# Patient Record
Sex: Male | Born: 1940 | Race: White | Hispanic: No | Smoking: Never smoker
Health system: Southern US, Community
[De-identification: ages and names within clinical notes are randomized; demographics above are authoritative.]

## PROBLEM LIST (undated history)

## (undated) DIAGNOSIS — H3530 Unspecified macular degeneration: Secondary | ICD-10-CM

## (undated) DIAGNOSIS — I10 Essential (primary) hypertension: Secondary | ICD-10-CM

## (undated) DIAGNOSIS — M109 Gout, unspecified: Secondary | ICD-10-CM

## (undated) DIAGNOSIS — F40240 Claustrophobia: Secondary | ICD-10-CM

## (undated) DIAGNOSIS — L309 Dermatitis, unspecified: Secondary | ICD-10-CM

## (undated) DIAGNOSIS — Z86010 Personal history of colonic polyps: Secondary | ICD-10-CM

## (undated) DIAGNOSIS — J189 Pneumonia, unspecified organism: Secondary | ICD-10-CM

## (undated) DIAGNOSIS — Z860101 Personal history of adenomatous and serrated colon polyps: Secondary | ICD-10-CM

## (undated) DIAGNOSIS — I2510 Atherosclerotic heart disease of native coronary artery without angina pectoris: Secondary | ICD-10-CM

## (undated) DIAGNOSIS — I714 Abdominal aortic aneurysm, without rupture, unspecified: Secondary | ICD-10-CM

## (undated) DIAGNOSIS — E785 Hyperlipidemia, unspecified: Secondary | ICD-10-CM

## (undated) DIAGNOSIS — G4730 Sleep apnea, unspecified: Secondary | ICD-10-CM

## (undated) DIAGNOSIS — M1990 Unspecified osteoarthritis, unspecified site: Secondary | ICD-10-CM

## (undated) DIAGNOSIS — J45909 Unspecified asthma, uncomplicated: Secondary | ICD-10-CM

## (undated) DIAGNOSIS — Z87442 Personal history of urinary calculi: Secondary | ICD-10-CM

## (undated) HISTORY — DX: Essential (primary) hypertension: I10

## (undated) HISTORY — PX: POLYPECTOMY: SHX149

## (undated) HISTORY — DX: Hyperlipidemia, unspecified: E78.5

## (undated) HISTORY — DX: Personal history of adenomatous and serrated colon polyps: Z86.0101

## (undated) HISTORY — DX: Abdominal aortic aneurysm, without rupture: I71.4

## (undated) HISTORY — DX: Personal history of colonic polyps: Z86.010

## (undated) HISTORY — DX: Abdominal aortic aneurysm, without rupture, unspecified: I71.40

## (undated) HISTORY — DX: Gout, unspecified: M10.9

## (undated) HISTORY — DX: Unspecified osteoarthritis, unspecified site: M19.90

## (undated) HISTORY — DX: Unspecified asthma, uncomplicated: J45.909

## (undated) HISTORY — PX: TONSILLECTOMY: SUR1361

## (undated) HISTORY — DX: Dermatitis, unspecified: L30.9

## (undated) HISTORY — PX: COLONOSCOPY W/ POLYPECTOMY: SHX1380

---

## 1974-11-08 HISTORY — PX: RHINOPLASTY: SUR1284

## 2006-11-08 HISTORY — PX: OTHER SURGICAL HISTORY: SHX169

## 2007-05-16 ENCOUNTER — Encounter: Admission: RE | Admit: 2007-05-16 | Discharge: 2007-05-16 | Payer: Self-pay | Admitting: Orthopedic Surgery

## 2007-06-21 ENCOUNTER — Ambulatory Visit (HOSPITAL_BASED_OUTPATIENT_CLINIC_OR_DEPARTMENT_OTHER): Admission: RE | Admit: 2007-06-21 | Discharge: 2007-06-21 | Payer: Self-pay | Admitting: Orthopedic Surgery

## 2007-10-17 ENCOUNTER — Encounter: Admission: RE | Admit: 2007-10-17 | Discharge: 2007-10-17 | Payer: Self-pay | Admitting: Orthopedic Surgery

## 2007-11-23 ENCOUNTER — Ambulatory Visit (HOSPITAL_BASED_OUTPATIENT_CLINIC_OR_DEPARTMENT_OTHER): Admission: RE | Admit: 2007-11-23 | Discharge: 2007-11-23 | Payer: Self-pay | Admitting: Orthopedic Surgery

## 2008-04-17 ENCOUNTER — Inpatient Hospital Stay (HOSPITAL_COMMUNITY): Admission: RE | Admit: 2008-04-17 | Discharge: 2008-04-19 | Payer: Self-pay | Admitting: Orthopedic Surgery

## 2008-11-08 HISTORY — PX: RETINAL LASER PROCEDURE: SHX2339

## 2011-03-23 NOTE — Op Note (Signed)
NAMEELZIA, HOTT NO.:  [id number]   MEDICAL RECORD NO.:  [id number]          PATIENT TYPE:  AMB   LOCATION:  DSC                          FACILITY:  [facility]   PHYSICIAN:  Loreta Ave, M.D. DATE OF BIRTH:  1941/01/30   DATE OF PROCEDURE:  06/21/2007  DATE OF DISCHARGE:                               OPERATIVE REPORT   PREOPERATIVE DIAGNOSIS:  Medial meniscus tear, left knee.   POSTOPERATIVE DIAGNOSIS:  Degenerative tear medial and lateral meniscus,  left knee, with grade 2 chondromalacia of the patella and grade 2 and 3  changes medial femoral condyle.   PROCEDURE:  Left knee exam under anesthesia, arthroscopy with partial  medial and lateral meniscectomy, chondroplasty primarily medial femoral  condyle.   SURGEON:  Loreta Ave, M.D.   ASSISTANT:  Genene Churn. Barry Dienes, P.A.-C.   ANESTHESIA:  General.   BLOOD LOSS:  Minimal.   SPECIMENS:  None.   CULTURES:  None.   COMPLICATIONS:  None.   DRESSING:  Soft compressive.   DESCRIPTION OF PROCEDURE:  The patient was brought to the operating room  and placed on operating table in a supine position.  After adequate  anesthesia had been obtained, knee examined.  Good motion, good  stability, positive medial McMurray's.  Tourniquet and leg holder  applied.  Leg prepped and draped in the usual sterile fashion.  Three  portals created, one superolateral, one each medial and lateral  parapatellar.  Inflow catheter introduced, the knee distended,  arthroscope introduced, knee inspected.  Good patellofemoral tracking.  Grade 2 changes.  Cruciate was intact.  Medial side diffuse grade 3  changes and thinning on the condyle debrided with chondroplasty.  Still  remaining cartilage.  Plateau looked good.  Marked complex tearing  posterior half of medial meniscus with fragments flipped onto the  meniscus.  Most of the posterior half removed until I got to reasonable  tissue.  Tapered into remaining meniscus.   Lateral side had no  significant degenerative but radial tearing throughout the back and  middle of the lateral meniscus.  Saucerized out to a stable rim,  retaining fair amount of meniscus at  completion.  The entire knee examined and no other findings appreciated.  Instruments and fluid removed.  Portals were injected with Marcaine.  Portals closed with 4-0 nylon.  Sterile compressive dressing applied.  Anesthesia reversed.  Brought to the recovery room.  Tolerated surgery  well.  No complications.      Loreta Ave, M.D.  Electronically Signed     DFM/MEDQ  D:  06/21/2007  T:  06/22/2007  Job:  [id number]

## 2011-03-23 NOTE — Op Note (Signed)
NAMEMYCAL, CONDE NO.:  [id number]   MEDICAL RECORD NO.:  [id number]          PATIENT TYPE:  INP   LOCATION:  5021                         FACILITY:  [facility]   PHYSICIAN:  Loreta Ave, M.D. DATE OF BIRTH:  [birth year]   DATE OF PROCEDURE:  04/17/2008  DATE OF DISCHARGE:                               OPERATIVE REPORT   PREOPERATIVE DIAGNOSIS:  Left knee medial compartment arthritis, end-  stage, bone-on-bone.   POSTOPERATIVE DIAGNOSIS:  Left knee medial compartment arthritis, end-  stage, bone-on-bone.   PROCEDURE:  Unicompartmental replacement, left knee.  Stryker partial  knee replacement.  Cemented pegged #5 femoral component.  Cemented #4  tibial component with 8-mm polyethylene insert.  Medial capsule release.   SURGEON:  Loreta Ave, MD   ASSISTANT:  Genene Churn. Barry Dienes, [place], present throughout the entire case as  necessary for timely completion of procedure.   ANESTHESIA:  General.   ESTIMATED BLOOD LOSS:  Minimal.   TOURNIQUET TIME:  1 hour 30 minutes.   SPECIMENS:  None.   CONSULTATIONS:  None.   COMPLICATIONS:  None.   DRESSING:  Soft compressive with knee immobilizer.   DRAINS:  None utilized.   PROCEDURE:  The patient was brought to the operating room and placed on  the operating table in supine position.  After adequate anesthesia had  been obtained, the knee was examined.  Minimal flexion contracture about  5 degrees of varus barely correctable to neutral.  Fairly flexion.  Other leg was stable.  Tourniquet was applied, prepped and draped in  usual sterile fashion.  Exsanguinated with Esmarch and tourniquet  inflated to 350 mmHg.  Straight incision along the medial border of the  patella, patellar tendon down to tibial tubercle.  Skin and subcutaneous  tissues divided.  Medial arthrotomy.  Sufficient mobilization with  release of the medial capsule for exposure and balancing the knee.  Grade 4 changes, medial compartment.  Mild  changes, lateral and  patellofemoral but those compartments looked good.  Remnants of medial  meniscus removed.  Extramedullary guide on the tibia.  Protecting the  tibial spine with a reciprocating saw, I measured and then resected  adequate tibia on the medial side to allow for the 8-mm implant.  This  was done with a 0-degree cup perpendicular to the long angle of the leg  to balance the knee.  Sized a #4 component.  All recess was examined and  all debris was removed.  I then measured the gap in flexion/extension  choosing the appropriate resection guide for the distal femoral cut.  This was done with the knee in extension.  With appropriate jigs in  place, distal cut was made protecting the surrounding structures.  I  then did the chamfer and posterior cuts on the femur with appropriate  jigs.  All cuts were then cleaned up and trimmed throughout.  All  periarticular spurs were removed.  All recess examined.  After  appropriate trials, I chose #5 to the femur, #4 on the tibia which fit  well.  On the front of the femur with the femoral  component, I was just  below the tied mark in good position.  Nicely balanced knee with full  extension, full flexion, and good mechanical axis, I chose the  appropriate trials.  Both the tibia and femur were then drilled for the  definitive components through the trials.  All trials were removed.  Copious irrigation with pulse irrigating device.  Cement prepared and  placed on the tibial and femoral components which were hammered in  place.  Excess cement removed.  Polyethylene attached to the tibial  component.  Knee reduced.  Once cement hardened, the knee was  reexamined.  Normal mechanical axis with some slight valgus alignment,  full extension, full flexion, and nice smooth filling throughout.  Wound  was irrigated.  Arthrotomy closed with #1 Vicryl.  Skin and subcutaneous  tissue with Vicryl and staples.  Margin of wound and knee injected with   Marcaine.  Sterile compressive dressing applied.  Tourniquet was  inflated and removed.  Knee immobilizer applied.  Anesthesia reversed.  Brought to the recovery room.  Tolerated the surgery well with no  complications.      Loreta Ave, M.D.  Electronically Signed     DFM/MEDQ  D:  04/17/2008  T:  04/18/2008  Job:  [id number]

## 2011-03-23 NOTE — Op Note (Signed)
[name], Grant Edwards NO.:  [id number]   MEDICAL RECORD NO.:  [id number]          PATIENT TYPE:  AMB   LOCATION:  DSC                          FACILITY:  [facility]   PHYSICIAN:  Loreta Ave, M.D. 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   PROCEDURE:  Left knee exam under anesthesia, arthroscopy, debridement of  the midportion of the medial and lateral meniscus.  Extensive  chondroplasty, debridement and then microfracturing of the area of  avascular necrosis medial femoral condyle.  Loose bodies removed.   SURGEON:  Loreta Ave, M.D.   ASSISTANT:  Genene Churn. Denton Meek.   ANESTHESIA:  General   BLOOD LOSS:  Minimal.   SPECIMENS:  None.   CULTURES:  None.   COMPLICATIONS:  None.   DRESSINGS:  Soft compressive.   DESCRIPTION OF PROCEDURE:  The patient brought to the operating room,  placed on the operating table in the supine position.  After adequate  anesthesia had been obtained, tourniquet and leg holder applied.  Leg  prepped and draped in the usual sterile fashion.  Three portals created,  one superolateral, one each medial-and-lateral parapatellar.  Inflow  catheter introduced yielding a fair amount of reactive serous joint  fluid.  Arthroscope was introduced and the knee distended, knee  inspected.  Some grade 2 chondromalacia on the peak of patella not  extensive.  Good  tracking.   Lateral compartment looked good, but some free edge tearing of the  lateral meniscus, middle and anterior third, debrided out, and tapered  in smoothly.  Cruciate ligaments was intact.  Medial compartment  revealed previous partial medial meniscectomy with some new free edge  tears in the front.  They were debrided off and tapered in smoothly.  Unfortunately, the medial femoral condyle had an area of full-thickness  chondral loss and avascular necrosis measuring 15 x 10 mm right in the  central portion.   All the fragmented bone and cartilage over the top of the lesion  debrided.  This was a contained lesion, but quite large.  I then did a  series of very deep microfracturing throughout.  Lowered the pressure of  the fluid in the knee, to confirm I got bleeding throughout.   Once this was confirmed, the entire knee was examined to ensure all  fragments removed.  Instruments and fluid removed.  Portals injected  Marcaine and closed with nylon.  Sterile compressive dressing applied.  Anesthesia reversed.  Brought to recovery room.  Tolerated surgery well  without complications.      Loreta Ave, M.D.  Electronically  Signed     DFM/MEDQ  D:  11/23/2007  T:  11/23/2007  Job:  [id number]

## 2011-07-29 LAB — BASIC METABOLIC PANEL
BUN: 15
Chloride: 103
Glucose, Bld: 89
Potassium: 4
Sodium: 139

## 2011-07-29 LAB — POCT HEMOGLOBIN-HEMACUE: Hemoglobin: 14.5

## 2011-08-05 LAB — URINALYSIS, ROUTINE W REFLEX MICROSCOPIC
Glucose, UA: NEGATIVE
Hgb urine dipstick: NEGATIVE
Nitrite: NEGATIVE
Protein, ur: NEGATIVE

## 2011-08-05 LAB — CBC
HCT: 40.3
HCT: 45.4
Hemoglobin: 14.3
MCHC: 35.1
MCV: 86.5
MCV: 87.2
RBC: 4.66
RBC: 4.76
RBC: 5.21
WBC: 11.1 — ABNORMAL HIGH
WBC: 7.7

## 2011-08-05 LAB — COMPREHENSIVE METABOLIC PANEL
ALT: 12
BUN: 13
CO2: 30
GFR calc non Af Amer: >60
Glucose, Bld: 85
Potassium: 3.5
Sodium: 140
Total Bilirubin: 1.1
Total Protein: 7.3

## 2011-08-05 LAB — TYPE AND SCREEN
ABO/RH(D): O POS
Antibody Screen: NEGATIVE

## 2011-08-05 LAB — BASIC METABOLIC PANEL
CO2: 28
Chloride: 98
Creatinine, Ser: 1.03
Creatinine, Ser: 1.07
GFR calc Af Amer: >60
GFR calc Af Amer: >60
GFR calc non Af Amer: >60
Glucose, Bld: 107 — ABNORMAL HIGH
Potassium: 4.2
Sodium: 135

## 2011-08-05 LAB — APTT: aPTT: 34

## 2011-08-23 LAB — BASIC METABOLIC PANEL
CO2: 27
Calcium: 10
Chloride: 103
GFR calc non Af Amer: >60
Potassium: 3.9
Sodium: 139

## 2011-08-23 LAB — POCT HEMOGLOBIN-HEMACUE
Hemoglobin: 16
Operator id: 116011

## 2015-03-03 ENCOUNTER — Other Ambulatory Visit: Payer: Self-pay | Admitting: Sports Medicine

## 2015-03-03 DIAGNOSIS — M5441 Lumbago with sciatica, right side: Secondary | ICD-10-CM

## 2015-03-04 ENCOUNTER — Other Ambulatory Visit: Payer: Self-pay | Admitting: Sports Medicine

## 2015-03-04 ENCOUNTER — Ambulatory Visit
Admission: RE | Admit: 2015-03-04 | Discharge: 2015-03-04 | Disposition: A | Discharge status: Home / Self Care | Payer: Self-pay | Source: Ambulatory Visit | Attending: Sports Medicine | Admitting: Sports Medicine

## 2015-03-04 DIAGNOSIS — M5441 Lumbago with sciatica, right side: Secondary | ICD-10-CM

## 2015-06-19 ENCOUNTER — Encounter: Payer: Self-pay | Admitting: Vascular Surgery

## 2015-07-11 ENCOUNTER — Encounter: Payer: Self-pay | Admitting: Vascular Surgery

## 2015-07-15 ENCOUNTER — Ambulatory Visit (INDEPENDENT_AMBULATORY_CARE_PROVIDER_SITE_OTHER): Payer: Medicare Other | Admitting: Vascular Surgery

## 2015-07-15 ENCOUNTER — Encounter: Payer: Self-pay | Admitting: Vascular Surgery

## 2015-07-15 VITALS — BP 134/79 | HR 91 | Resp 16 | Ht 71.5 in | Wt 253.0 lb

## 2015-07-15 DIAGNOSIS — I714 Abdominal aortic aneurysm, without rupture, unspecified: Secondary | ICD-10-CM

## 2015-07-15 NOTE — Progress Notes (Signed)
Patient name: Grant Edwards MRN: [id number] DOB: [birth year] Sex: male   Referred by: Joylene Draft  Reason for referral:  Chief Complaint  Patient presents with  . New Evaluation    AAA  referred by Dr Reed Breech    HISTORY OF PRESENT ILLNESS: Patient is today for discussion of recent diagnosis of abdominal aortic aneurysm. 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 Is no symptoms related to his aneurysm. He has no family history of aneurysm. 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 He had a severe event caused additional imaging and had relief with prednisone treatment at that time. He has no cardiac illness. He does report that he has gained approximately 20-25 pounds over the past several months and is working on exercise and better diet  Past Medical History  Diagnosis Date  . AAA (abdominal aortic aneurysm)   . Hypertension   . Arthritis     Past Surgical History  Procedure Laterality Date  . Retinal laser procedure  2010  . Adenoma polyp      removed  . Rhinoplasty  1976  . Tonsillectomy    . Knee intercompartmental replacement  2008    Dr. Noemi Chapel     Social History   Social History  . Marital Status: Married    Spouse Name: N/A  . Number of Children: N/A  . Years of Education: N/A   Occupational History  . Not on file.   Social History Main Topics  . Smoking status: Never Smoker   . Smokeless tobacco: Never Used  . Alcohol Use: No  . Drug Use: No  . Sexual Activity: Not on file   Other Topics Concern  . Not on file   Social History Narrative    Family History  Problem Relation Age of Onset  . Cancer Mother   . Heart disease Father   . Heart attack Father     Allergies as of 07/15/2015 - Review Complete 07/15/2015  Allergen Reaction Noted  . Aspirin Other (See Comments) 07/15/2015  . Erythromycin Rash 07/15/2015    Current Outpatient  Prescriptions on File Prior to Visit  Medication Sig Dispense Refill  . amLODipine (NORVASC) 5 MG tablet Take 5 mg by mouth daily.    . hydrochlorothiazide (HYDRODIURIL) 25 MG tablet Take 25 mg by mouth daily.    Marland Kitchen lisinopril (PRINIVIL,ZESTRIL) 40 MG tablet Take 40 mg by mouth daily.     No current facility-administered medications on file prior to visit.     REVIEW OF SYSTEMS:  Positives indicated with an "X"  CARDIOVASCULAR:  [ ]  chest pain   [ ]  chest pressure   [ ]  palpitations   [ ]  orthopnea   [ ]  dyspnea on exertion   [ ]  claudication   [ ]  rest pain   [ ]  DVT   [ ]  phlebitis PULMONARY:   [ ]  productive cough   [x ] asthma   [ ]  wheezing NEUROLOGIC:   [ ]  weakness  [ ]  paresthesias  [ ]  aphasia  [ ]  amaurosis  [ ]  dizziness HEMATOLOGIC:   [ ]  bleeding problems   [ ]  clotting disorders MUSCULOSKELETAL:  [ ]  joint pain   [ ]  joint swelling GASTROINTESTINAL: [ ]   blood in stool  [ ]   hematemesis GENITOURINARY:  [ ]   dysuria  [ ]   hematuria PSYCHIATRIC:  [ ]  history of major  depression INTEGUMENTARY:  [ ]  rashes  [ ]  ulcers CONSTITUTIONAL:  [ ]  fever   [ ]  chills  PHYSICAL EXAMINATION:  General: The patient is a well-nourished male, in no acute distress. Vital signs are BP 134/79 mmHg  Pulse 91  Resp 16  Ht 5' 11.5" (1.816 m)  Wt 253 lb (114.76 kg)  BMI 34.80 kg/m2  SpO2 98% Pulmonary: There is a good air exchange bilaterally without wheezing or rales. Abdomen: Soft and non-tender with normal pitch bowel sounds. Moderate obesity. I do not palpate an aneurysm Musculoskeletal: There are no major deformities.  There is no significant extremity pain. Neurologic: No focal weakness or paresthesias are detected, Skin: There are no ulcer or rashes noted. Psychiatric: The patient has normal affect. Cardiovascular: There is a regular rate and rhythm without significant murmur appreciated. Carotid arteries without bruits bilaterally Pulse status: 2+ radial 2+ femoral 2+ popliteal  2+ dorsalis pedis and posterior tibial pulses with no evidence of peripheral aneurysm  I did review his ultrasound from 03/04/2015. This does show a 3 cm infrarenal aneurysm.  Impression and Plan:  Small infrarenal abdominal aortic aneurysm. Had a long discussion with the patient explaining the natural history with slow progression in size of the small aneurysm. Explained that he does not need to have any limitation to his current activity. Does need to have good blood pressure control. We will see him in one year with repeat ultrasound to rule out progression in size. Splane open and stent graft repair should he develop larger aneurysm. Also explain symptoms of aneurysm which he would report immediately to the emergency room should this occur    Arias Weinert [facility] [place]: [phone]

## 2015-07-16 NOTE — Addendum Note (Signed)
Addended by: Dorthula Rue L on: 07/16/2015 02:22 PM   Modules accepted: Orders

## 2015-10-17 DIAGNOSIS — J3089 Other allergic rhinitis: Secondary | ICD-10-CM | POA: Diagnosis not present

## 2015-10-23 ENCOUNTER — Ambulatory Visit (INDEPENDENT_AMBULATORY_CARE_PROVIDER_SITE_OTHER): Payer: Medicare Other | Admitting: *Deleted

## 2015-10-23 DIAGNOSIS — J309 Allergic rhinitis, unspecified: Secondary | ICD-10-CM | POA: Diagnosis not present

## 2015-10-23 NOTE — Progress Notes (Signed)
Immunotherapy   Patient Details  Name: Grant Edwards MRN: [id number] Date of Birth: [birth year]  10/23/2015  [name] here to pick up  red vial 1:100 Following schedule: C  Frequency: every 2-3 times a week Epi-Pen:Prescription for Epi-Pen given  Consent signed and patient instructions given. 0.1cc given today in right arm.    Joellyn Rued 10/23/2015, 3:26 PM

## 2016-04-15 DIAGNOSIS — J3089 Other allergic rhinitis: Secondary | ICD-10-CM | POA: Diagnosis not present

## 2016-04-19 ENCOUNTER — Ambulatory Visit (INDEPENDENT_AMBULATORY_CARE_PROVIDER_SITE_OTHER): Payer: Medicare Other

## 2016-04-19 DIAGNOSIS — J309 Allergic rhinitis, unspecified: Secondary | ICD-10-CM

## 2016-04-19 MED ORDER — EPINEPHRINE 0.3 MG/0.3ML IJ SOAJ: 0.3000 mg | Freq: Once | INTRAMUSCULAR | Status: DC

## 2016-04-19 NOTE — Progress Notes (Signed)
Immunotherapy   Patient Details  Name: Grant Edwards MRN: [id number] Date of Birth: 02/13/1941  04/19/2016  [name] here to pick up RED 1/100 GRASS-DMITE-MOLD @0 .10 THEN AT 0.50 EVERY 2 WEEKS. Following schedule: C  Frequency:1 time per week Epi-Pen:Prescription for Epi-Pen given Consent signed and patient instructions given. RX FOR NEEDLES HANDWRITTEN & EPIPEN SENT IN.   Loleta Dicker 04/19/2016, 3:17 PM

## 2016-07-15 ENCOUNTER — Encounter: Payer: Self-pay | Admitting: Family

## 2016-07-20 ENCOUNTER — Ambulatory Visit (HOSPITAL_COMMUNITY): Payer: Medicare Other

## 2016-07-20 ENCOUNTER — Ambulatory Visit: Payer: Medicare Other | Admitting: Family

## 2016-07-22 ENCOUNTER — Encounter: Payer: Self-pay | Admitting: Family

## 2016-07-26 ENCOUNTER — Ambulatory Visit (INDEPENDENT_AMBULATORY_CARE_PROVIDER_SITE_OTHER): Payer: Medicare Other | Admitting: Family

## 2016-07-26 ENCOUNTER — Encounter: Payer: Self-pay | Admitting: Family

## 2016-07-26 ENCOUNTER — Ambulatory Visit (HOSPITAL_COMMUNITY)
Admission: RE | Admit: 2016-07-26 | Discharge: 2016-07-26 | Disposition: A | Discharge status: Home / Self Care | Payer: Medicare Other | Source: Ambulatory Visit | Attending: Vascular Surgery | Admitting: Vascular Surgery

## 2016-07-26 VITALS — BP 125/76 | HR 63 | Temp 97.6°F | Resp 16 | Ht 71.5 in | Wt 239.0 lb

## 2016-07-26 DIAGNOSIS — Z87891 Personal history of nicotine dependence: Secondary | ICD-10-CM | POA: Diagnosis not present

## 2016-07-26 DIAGNOSIS — I714 Abdominal aortic aneurysm, without rupture, unspecified: Secondary | ICD-10-CM

## 2016-07-26 NOTE — Progress Notes (Signed)
[facility]   CC: Follow up Abdominal Aortic Aneurysm  History of Present Illness  Grant Edwards is a 75 y.o. (11-23-1940) male patient of Dr. Donnetta Hutching who returns for follow up of his abdominal aortic aneurysm.   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 He has no family history of aneurysm. 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 He had a severe event which caused additional imaging and had relief with prednisone treatment at that time. He has no cardiac illness.  Dr. Donnetta Hutching last saw pt on 07/15/15. At that time Dr. Donnetta Hutching noted a small infrarenal abdominal aortic aneurysm. He had a long discussion with the patient explaining the natural history with slow progression in size of the small aneurysm. Explained that he does not need to have any limitation to his current activity. Does need to have good blood pressure control.  He stopped smoking at age 25.    Past Medical History:  Diagnosis Date  . AAA (abdominal aortic aneurysm) ([place])   . Arthritis   . Hypertension    Past Surgical History:  Procedure Laterality Date  . adenoma polyp     removed  . knee intercompartmental replacement  2008   Dr. Noemi Chapel   . RETINAL LASER PROCEDURE  2010  . RHINOPLASTY  1976  . TONSILLECTOMY     Social History Social History   Social History  . Marital status: Married    Spouse name: N/A  . Number of children: N/A  . Years of education: N/A   Occupational History  . Not on file.   Social History Main Topics  . Smoking status: Never Smoker  . Smokeless tobacco: Never Used  . Alcohol use No  . Drug use: No  . Sexual activity: Not on file   Other Topics Concern  . Not on file   Social History Narrative  . No narrative on file   Family History Family History  Problem Relation Age of Onset  . Cancer Mother   . Heart disease Father   . Heart attack  Father     Current Outpatient Prescriptions on File Prior to Visit  Medication Sig Dispense Refill  . amLODipine (NORVASC) 5 MG tablet Take 5 mg by mouth daily.    Marland Kitchen EPINEPHrine (EPIPEN 2-PAK) 0.3 mg/0.3 mL IJ SOAJ injection Inject 0.3 mLs (0.3 mg total) into the muscle once. 2 Device 2  . hydrochlorothiazide (HYDRODIURIL) 25 MG tablet Take 25 mg by mouth daily.    Marland Kitchen lisinopril (PRINIVIL,ZESTRIL) 40 MG tablet Take 40 mg by mouth daily.     No current facility-administered medications on file prior to visit.    Allergies  Allergen Reactions  . Aspirin Other (See Comments)    asthma  . Erythromycin Rash    ROS: See HPI for pertinent positives and negatives.  Physical Examination  Vitals:   07/26/16 0911  BP: 125/76  Pulse: 63  Resp: 16  Temp: 97.6 F (36.4 C)  TempSrc: Oral  SpO2: 97%  Weight: 239 lb (108.4 kg)  Height: 5' 11.5" (1.816 m)   Body mass index is 32.87 kg/m.  General: A&O x 3, WD, obese male.  Pulmonary: Sym exp, respirations are non labored, good air movt, CTAB, no rales, rhonchi, or wheezing.  Cardiac: RRR, Nl S1, S2, no detected murmur.   Carotid Bruits Right Left   Negative Negative   Aorta is not  palpable Radial pulses are 2+ palpable                          VASCULAR EXAM:                                                                                                         LE Pulses Right Left       FEMORAL   palpable   palpable        POPLITEAL  not palpable   not palpable       POSTERIOR TIBIAL   palpable    palpable        DORSALIS PEDIS      ANTERIOR TIBIAL  palpable   palpable      Gastrointestinal: soft, NTND, -G/R, - HSM, - masses palpated, - CVAT B.  Musculoskeletal: M/S 5/5 throughout, Extremities without ischemic changes.  Neurologic: CN 2-12 intact, Pain and light touch intact in extremities are intact, Motor exam as listed above.  Non-Invasive Vascular Imaging  AAA Duplex (07/26/2016)  Previous size: 3 cm (Date:  03/04/15, CT)  Current size:  2.6 cm (Date: 07/26/16), limited visualization, normal diameters if both CIA's.   Medical Decision Making  The patient is a 75 y.o. male who presents with asymptomatic AAA with no increase in size.   Based on this patient's exam and diagnostic studies, the patient will follow up in 1 year  with the following studies: AAA duplex.  Consideration for repair of AAA would be made when the size is 5.5 cm, growth > 1 cm/yr, and symptomatic status.  I emphasized the importance of maximal medical management including strict control of blood pressure, blood glucose, and lipid levels, antiplatelet agents, obtaining regular exercise, and continued cessation of smoking.   The patient was given information about AAA including signs, symptoms, treatment, and how to minimize the risk of enlargement and rupture of aneurysms.    The patient was advised to call [phone] should the patient experience sudden onset abdominal or back pain.   Thank you for allowing [place] to participate in this patient's care.  Clemon Chambers, RN, MSN, FNP-C [facility] Office: [facility] Physician: Trula Slade  07/26/2016, 9:22 AM

## 2016-07-26 NOTE — Patient Instructions (Addendum)
Abdominal Aortic Aneurysm An aneurysm is a weakened or damaged part of an artery wall that bulges from the normal force of blood pumping through the body. An abdominal aortic aneurysm is an aneurysm that occurs in the lower part of the aorta, the main artery of the body.  The major concern with an abdominal aortic aneurysm is that it can enlarge and burst (rupture) or blood can flow between the layers of the wall of the aorta through a tear (aorticdissection). Both of these conditions can cause bleeding inside the body and can be life threatening unless diagnosed and treated promptly. CAUSES  The exact cause of an abdominal aortic aneurysm is unknown. Some contributing factors are:   A hardening of the arteries caused by the buildup of fat and other substances in the lining of a blood vessel (arteriosclerosis).  Inflammation of the walls of an artery (arteritis).   Connective tissue diseases, such as Marfan syndrome.   Abdominal trauma.   An infection, such as syphilis or staphylococcus, in the wall of the aorta (infectious aortitis) caused by bacteria. RISK FACTORS  Risk factors that contribute to an abdominal aortic aneurysm may include:  Age older than 60 years.   High blood pressure (hypertension).  Male gender.  Ethnicity (white race).  Obesity.  Family history of aneurysm (first degree relatives only).  Tobacco use. PREVENTION  The following healthy lifestyle habits may help decrease your risk of abdominal aortic aneurysm:  Quitting smoking. Smoking can raise your blood pressure and cause arteriosclerosis.  Limiting or avoiding alcohol.  Keeping your blood pressure, blood sugar level, and cholesterol levels within normal limits.  Decreasing your salt intake. In somepeople, too much salt can raise blood pressure and increase your risk of abdominal aortic aneurysm.  Eating a diet low in saturated fats and cholesterol.  Increasing your fiber intake by including  whole grains, vegetables, and fruits in your diet. Eating these foods may help lower blood pressure.  Maintaining a healthy weight.  Staying physically active and exercising regularly. SYMPTOMS  The symptoms of abdominal aortic aneurysm may vary depending on the size and rate of growth of the aneurysm.Most grow slowly and do not have any symptoms. When symptoms do occur, they may include:  Pain (abdomen, side, lower back, or groin). The pain may vary in intensity. A sudden onset of severe pain may indicate that the aneurysm has ruptured.  Feeling full after eating only small amounts of food.  Nausea or vomiting or both.  Feeling a pulsating lump in the abdomen.  Feeling faint or passing out. DIAGNOSIS  Since most unruptured abdominal aortic aneurysms have no symptoms, they are often discovered during diagnostic exams for other conditions. An aneurysm may be found during the following procedures:  Ultrasonography (A one-time screening for abdominal aortic aneurysm by ultrasonography is also recommended for all men aged 65-75 years who have ever smoked).  X-ray exams.  A computed tomography (CT).  Magnetic resonance imaging (MRI).  Angiography or arteriography. TREATMENT  Treatment of an abdominal aortic aneurysm depends on the size of your aneurysm, your age, and risk factors for rupture. Medication to control blood pressure and pain may be used to manage aneurysms smaller than 6 cm. Regular monitoring for enlargement may be recommended by your caregiver if:  The aneurysm is 3-4 cm in size (an annual ultrasonography may be recommended).  The aneurysm is 4-4.5 cm in size (an ultrasonography every 6 months may be recommended).  The aneurysm is larger than 4.5 cm in   size (your caregiver may ask that you be examined by a vascular surgeon). If your aneurysm is larger than 6 cm, surgical repair may be recommended. There are two main methods for repair of an aneurysm:   Endovascular  repair (a minimally invasive surgery). This is done most often.  Open repair. This method is used if an endovascular repair is not possible.   This information is not intended to replace advice given to you by your health care provider. Make sure you discuss any questions you have with your health care provider.   Document Released: 08/04/2005 Document Revised: 02/19/2013 Document Reviewed: 11/24/2012 Elsevier Interactive Patient Education 2016 Elsevier Inc.    Before your next abdominal ultrasound:  Take two Extra-Strength Gas-X capsules at bedtime the night before the test. Take another two Extra-Strength Gas-X capsules 3 hours before the test.   

## 2016-08-23 DIAGNOSIS — J3089 Other allergic rhinitis: Secondary | ICD-10-CM | POA: Diagnosis not present

## 2016-09-02 ENCOUNTER — Ambulatory Visit: Payer: Medicare Other

## 2016-09-06 ENCOUNTER — Ambulatory Visit (INDEPENDENT_AMBULATORY_CARE_PROVIDER_SITE_OTHER): Payer: Medicare Other

## 2016-09-06 ENCOUNTER — Ambulatory Visit: Payer: Medicare Other

## 2016-09-06 DIAGNOSIS — J309 Allergic rhinitis, unspecified: Secondary | ICD-10-CM | POA: Diagnosis not present

## 2016-09-06 NOTE — Progress Notes (Signed)
Immunotherapy   Patient Details  Name: Grant Edwards MRN: [id number] Date of Birth: 05-04-1941  09/06/2016  [name] here to pick up red 1/100 Grass-Dmite-Mold at 0.10 Following schedule: C  Frequency:1 time per week Epi-Pen:Epi-Pen Available  Consent signed and patient instructions given.   Loleta Dicker 09/06/2016, 3:07 PM

## 2016-10-14 NOTE — Addendum Note (Signed)
Addended by: Lianne Cure A on: 10/14/2016 08:28 AM   Modules accepted: Orders

## 2016-11-30 NOTE — Addendum Note (Signed)
Addended by: Felipa Emory on: 11/30/2016 09:59 AM   Modules accepted: Orders

## 2017-02-10 DIAGNOSIS — J3089 Other allergic rhinitis: Secondary | ICD-10-CM

## 2017-02-21 ENCOUNTER — Ambulatory Visit: Payer: Self-pay

## 2017-06-21 NOTE — Progress Notes (Signed)
Mt vial made Exp. 06-22-18

## 2017-06-23 DIAGNOSIS — J301 Allergic rhinitis due to pollen: Secondary | ICD-10-CM | POA: Diagnosis not present

## 2017-07-04 ENCOUNTER — Ambulatory Visit: Payer: Self-pay

## 2017-07-06 ENCOUNTER — Ambulatory Visit (INDEPENDENT_AMBULATORY_CARE_PROVIDER_SITE_OTHER): Payer: Medicare Other | Admitting: *Deleted

## 2017-07-06 DIAGNOSIS — J309 Allergic rhinitis, unspecified: Secondary | ICD-10-CM

## 2017-07-06 NOTE — Progress Notes (Signed)
Immunotherapy   Patient Details  Name: Grant Edwards MRN: [id number] Date of Birth: [birth year]  07/06/2017  Grant Edwards here to pick up  red vial grass-dmite-mold Expiration date:06/22/18 Following schedule: C  Frequency: @0 .5cc every 2-3 weeks Epi-Pen: Epipen available.  Consent signed and patient instructions given.   [name] 07/06/2017, 1:55 PM

## 2017-07-26 ENCOUNTER — Ambulatory Visit: Payer: Medicare Other | Admitting: Pediatrics

## 2017-08-09 ENCOUNTER — Encounter: Payer: Self-pay | Admitting: Family

## 2017-08-09 ENCOUNTER — Ambulatory Visit (HOSPITAL_COMMUNITY)
Admission: RE | Admit: 2017-08-09 | Discharge: 2017-08-09 | Disposition: A | Discharge status: Home / Self Care | Payer: Medicare Other | Source: Ambulatory Visit | Attending: Family | Admitting: Family

## 2017-08-09 ENCOUNTER — Ambulatory Visit (INDEPENDENT_AMBULATORY_CARE_PROVIDER_SITE_OTHER): Payer: Medicare Other | Admitting: Family

## 2017-08-09 VITALS — BP 111/71 | HR 59 | Temp 97.3°F | Resp 18 | Ht 71.5 in | Wt 238.0 lb

## 2017-08-09 DIAGNOSIS — Z87891 Personal history of nicotine dependence: Secondary | ICD-10-CM

## 2017-08-09 DIAGNOSIS — R0989 Other specified symptoms and signs involving the circulatory and respiratory systems: Secondary | ICD-10-CM

## 2017-08-09 DIAGNOSIS — I714 Abdominal aortic aneurysm, without rupture, unspecified: Secondary | ICD-10-CM

## 2017-08-09 DIAGNOSIS — I499 Cardiac arrhythmia, unspecified: Secondary | ICD-10-CM

## 2017-08-09 NOTE — Progress Notes (Signed)
[facility]   CC: Follow up Abdominal Aortic Aneurysm  History of Present Illness  Eugune Sine is a 76 y.o. (1940/11/17) male patient of Dr. Donnetta Hutching who returns for follow up of his abdominal aortic aneurysm.   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 He has no family history of aneurysm. 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 He had a severe event which caused additional imaging and had relief with prednisone treatment at that time. He has no cardiac illness.   Dr. Donnetta Hutching last saw pt on 07/15/15. At that time Dr. Donnetta Hutching noted a small infrarenal abdominal aortic aneurysm. He had a long discussion with the patient explaining the natural history with slow progression in size of the small aneurysm. Explained that he does not need to have any limitation to his current activity. Does need to have good blood pressure control.  Pt states he is on a strict diet for weight reduction and states he has had some success.   The patient denies claudication in legs with walking. The patient denies history of stroke or TIA symptoms.  Pt denies fatigue, denies dyspnea, denies feeling lightheaded; states he has no known hx of cardiac arrhthymia.   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   Pt Diabetic: No Pt smoker: former smoker, quit at age 63 years   Past Medical History:  Diagnosis Date  . AAA (abdominal aortic aneurysm) ([place])   . Arthritis   . Hypertension    Past Surgical History:  Procedure Laterality Date  . adenoma polyp     removed  . knee intercompartmental replacement  2008   Dr. Noemi Chapel   . RETINAL LASER PROCEDURE  2010  . RHINOPLASTY  1976  . TONSILLECTOMY     Social History Social History   Social History  . Marital status: Married    Spouse name: N/A  . Number of children: N/A  . Years of  education: N/A   Occupational History  . Not on file.   Social History Main Topics  . Smoking status: Never Smoker  . Smokeless tobacco: Never Used  . Alcohol use No  . Drug use: No  . Sexual activity: Not on file   Other Topics Concern  . Not on file   Social History Narrative  . No narrative on file   Family History Family History  Problem Relation Age of Onset  . Cancer Mother   . Heart disease Father   . Heart attack Father     Current Outpatient Prescriptions on File Prior to Visit  Medication Sig Dispense Refill  . amLODipine (NORVASC) 5 MG tablet Take 5 mg by mouth daily.    Marland Kitchen EPINEPHrine (EPIPEN 2-PAK) 0.3 mg/0.3 mL IJ SOAJ injection Inject 0.3 mLs (0.3 mg total) into the muscle once. 2 Device 2  . hydrochlorothiazide (HYDRODIURIL) 25 MG tablet Take 25 mg by mouth daily.    Marland Kitchen lisinopril (PRINIVIL,ZESTRIL) 40 MG tablet Take 40 mg by mouth daily.     No current facility-administered medications on file prior to visit.    Allergies  Allergen Reactions  . Aspirin Other (See Comments)    asthma  . Erythromycin Rash    ROS: See HPI for pertinent positives and negatives.  Physical Examination  Vitals:   08/09/17 0847  BP: 111/71  Pulse: (!) 59  Resp: 18  Temp: (!) 97.3 F (36.3 C)  SpO2: 96%  Weight: 238 lb (108 kg)  Height: 5' 11.5" (1.816 m)   Body mass index is 32.73 kg/m.  General: A&O x 3, WD, obese male.  Pulmonary: Sym exp, respirations are non labored, good air movt, CTAB, no rales, rhonchi, or wheezing.  Cardiac: Irregular rhythm, rate at 59/minute Nl S1, S2, no detected murmur.   Carotid Bruits Right Left   Negative Negative    Abdominal aortic pulse is not palpable Radial pulses are 2+ palpable                          VASCULAR EXAM:                                                                                                                                           LE Pulses Right Left       FEMORAL not palpable (obese)   not palpable        POPLITEAL  2+ palpable   2+ palpable       POSTERIOR TIBIAL   palpable    palpable        DORSALIS PEDIS      ANTERIOR TIBIAL  palpable   palpable      Gastrointestinal: soft, NTND, -G/R, - HSM, - masses palpated, - CVAT B.  Musculoskeletal: M/S 5/5 throughout, Extremities without ischemic changes.  Neurologic: CN 2-12 intact, Pain and light touch intact in extremities are intact, Motor exam as listed above    Non-Invasive Vascular Imaging  AAA Duplex (08/09/2017)  Previous size: 2.6 cm (Date: 07/26/16), limited visualization, normal diameters if both CIA's.  Current size:  2.5 cm (Date: 08/09/17); Right CIA: 1.5 cm; Left CIA: 1.1 cm  Medical Decision Making  The patient is a 76 y.o. male who presents with a small asymptomatic AAA with no increase in size and normal diameters of common iliac arteries.   He has prominent bilateral popliteal pulses and no claudication sx's, see Plan.  His cardiac rhythm is irregular, he is asymptomatic of this, and he has no known hx of cardiac arrhythmia. He states he will contact Dr. Abner Greenspan re this.    Based on this patient's exam and diagnostic studies, the patient will follow up in 2 years  with the following studies: AAA duplex and bilateral popliteal duplex.  Consideration for repair of AAA would be made when the size is 5.0 cm, growth > 1 cm/yr, and symptomatic status.        The patient was given information about AAA including signs, symptoms, treatment, and how to minimize the risk of enlargement and rupture of aneurysms.    I emphasized the importance of maximal medical management including strict control of blood pressure, blood glucose, and lipid levels, antiplatelet agents, obtaining regular exercise, and continued cessation of  smoking.   The patient was advised to call 911 should the patient experience sudden onset abdominal or back pain.   Thank you for allowing [place] to participate in this  patient's care.  Clemon Chambers, RN, MSN, FNP-C [facility] Office: [phone]  Clinic Physician: Early  08/09/2017, 8:53 AM

## 2017-08-09 NOTE — Patient Instructions (Signed)
Before your next abdominal ultrasound:  Take two Extra-Strength Gas-X capsules at bedtime the night before the test. Take another two Extra-Strength Gas-X capsules 3 hours before the test.  Avoid gas forming foods the day before the test.       Abdominal Aortic Aneurysm Blood pumps away from the heart through tubes (blood vessels) called arteries. Aneurysms are weak or damaged places in the wall of an artery. It bulges out like a balloon. An abdominal aortic aneurysm happens in the main artery of the body (aorta). It can burst or tear, causing bleeding inside the body. This is an emergency. It needs treatment right away. What are the causes? The exact cause is unknown. Things that could cause this problem include:  Fat and other substances building up in the lining of a tube.  Swelling of the walls of a blood vessel.  Certain tissue diseases.  Belly (abdominal) trauma.  An infection in the main artery of the body.  What increases the risk? There are things that make it more likely for you to have an aneurysm. These include:  Being over the age of 74 years old.  Having high blood pressure (hypertension).  Being a male.  Being white.  Being very overweight (obese).  Having a family history of aneurysm.  Using tobacco products.  What are the signs or symptoms? Symptoms depend on the size of the aneurysm and how fast it grows. There may not be symptoms. If symptoms occur, they can include:  Pain (belly, side, lower back, or groin).  Feeling full after eating a small amount of food.  Feeling sick to your stomach (nauseous), throwing up (vomiting), or both.  Feeling a lump in your belly that feels like it is beating (pulsating).  Feeling like you will pass out (faint).  How is this treated?  Medicine to control blood pressure and pain.  Imaging tests to see if the aneurysm gets bigger.  Surgery. How is this prevented? To lessen your chance of getting this  condition:  Stop smoking. Stop chewing tobacco.  Limit or avoid alcohol.  Keep your blood pressure, blood sugar, and cholesterol within normal limits.  Eat less salt.  Eat foods low in saturated fats and cholesterol. These are found in animal and whole dairy products.  Eat more fiber. Fiber is found in whole grains, vegetables, and fruits.  Keep a healthy weight.  Stay active and exercise often.  This information is not intended to replace advice given to you by your health care provider. Make sure you discuss any questions you have with your health care provider. Document Released: 02/19/2013 Document Revised: 04/01/2016 Document Reviewed: 11/24/2012 Elsevier Interactive Patient Education  2017 Elsevier Inc.  

## 2017-08-16 ENCOUNTER — Ambulatory Visit: Payer: Medicare Other | Admitting: Pediatrics

## 2017-08-29 ENCOUNTER — Ambulatory Visit (INDEPENDENT_AMBULATORY_CARE_PROVIDER_SITE_OTHER): Payer: Medicare Other | Admitting: Pediatrics

## 2017-08-29 ENCOUNTER — Encounter: Payer: Self-pay | Admitting: Pediatrics

## 2017-08-29 VITALS — BP 130/80 | HR 64 | Temp 97.8°F | Resp 16 | Ht 68.9 in | Wt 242.2 lb

## 2017-08-29 DIAGNOSIS — I10 Essential (primary) hypertension: Secondary | ICD-10-CM | POA: Diagnosis not present

## 2017-08-29 DIAGNOSIS — T7800XD Anaphylactic reaction due to unspecified food, subsequent encounter: Secondary | ICD-10-CM | POA: Diagnosis not present

## 2017-08-29 DIAGNOSIS — T7800XA Anaphylactic reaction due to unspecified food, initial encounter: Secondary | ICD-10-CM | POA: Insufficient documentation

## 2017-08-29 DIAGNOSIS — L503 Dermatographic urticaria: Secondary | ICD-10-CM | POA: Insufficient documentation

## 2017-08-29 DIAGNOSIS — J45998 Other asthma: Secondary | ICD-10-CM | POA: Diagnosis not present

## 2017-08-29 DIAGNOSIS — T39015A Adverse effect of aspirin, initial encounter: Secondary | ICD-10-CM | POA: Diagnosis not present

## 2017-08-29 DIAGNOSIS — J4530 Mild persistent asthma, uncomplicated: Secondary | ICD-10-CM | POA: Insufficient documentation

## 2017-08-29 DIAGNOSIS — Z9989 Dependence on other enabling machines and devices: Secondary | ICD-10-CM | POA: Diagnosis not present

## 2017-08-29 DIAGNOSIS — J3089 Other allergic rhinitis: Secondary | ICD-10-CM | POA: Insufficient documentation

## 2017-08-29 DIAGNOSIS — G4733 Obstructive sleep apnea (adult) (pediatric): Secondary | ICD-10-CM | POA: Diagnosis not present

## 2017-08-29 MED ORDER — MONTELUKAST SODIUM 10 MG PO TABS: ORAL_TABLET | ORAL | 5 refills | Status: DC

## 2017-08-29 MED ORDER — ALBUTEROL SULFATE HFA 108 (90 BASE) MCG/ACT IN AERS: 2.0000 | INHALATION_SPRAY | RESPIRATORY_TRACT | 1 refills | Status: AC | PRN

## 2017-08-29 NOTE — Progress Notes (Addendum)
[place] [id number] Dept: [phone]  New Patient Note  Patient ID: Grant Edwards, male    DOB: 1941/07/12  Age: 76 y.o. MRN: [id number] Date of Office Visit: 08/29/2017 Referring provider: Crist Infante, MD [address] [place], [place] [address]    Chief Complaint: Allergic Rhinitis  and Asthma  HPI Grant Edwards presents for evaluation of allergic rhinitis and asthma and food allergies He has been on allergy injections for several years and they have helped him greatly. He has been on allergy injections every 2 weeks and would like to know if he can stop his allergy injections. He is being treated with grass pollen, molds and dust mite He had a severe allergic reaction to broccoli , mushrooms and grapes in the past so he avoids them . He is now able to add other foods that he was allergic to. He has been coughing for about 2 months. His daughter recently gave him 2 cats to take care of. Sometimes foods with citric  make him have thick mucus.. He has had asthma for several years and now uses only Ventolin if needed. He has a history of eczema. Aspirin gave him asthmatic symptoms in the past so he avoids all nonsteroidals. He has obstructive sleep apnea and uses CPAP.  Review of Systems  Constitutional: Negative.   HENT:       Nasal congestion for several years. Obstructive sleep apnea needing CPAP  Eyes: Negative.   Respiratory:       Asthma for several years  Cardiovascular:       Hypertension  Gastrointestinal: Negative.   Genitourinary: Negative.   Musculoskeletal:       Arthritis worse with cold weather  Skin:       History of eczema and itchy skin at times  Neurological: Negative.   Endo/Heme/Allergies:       No diabetes or thyroid disease  Psychiatric/Behavioral: Negative.     Outpatient Encounter Prescriptions as of 08/29/2017  Medication Sig  . amLODipine (NORVASC) 5 MG tablet Take 5 mg by mouth.  . EPINEPHrine 0.3 mg/0.3 mL IJ SOAJ  injection Inject 0.3 mg into the muscle.  . hydrochlorothiazide (HYDRODIURIL) 25 MG tablet Take 25 mg by mouth.  Marland Kitchen lisinopril (PRINIVIL,ZESTRIL) 40 MG tablet Take 40 mg by mouth.  Marland Kitchen albuterol (VENTOLIN HFA) 108 (90 Base) MCG/ACT inhaler Inhale 2 puffs into the lungs every 4 (four) hours as needed for wheezing or shortness of breath.  . montelukast (SINGULAIR) 10 MG tablet Take 1 tablet once a day for coughing or wheezing.  . [DISCONTINUED] amLODipine (NORVASC) 5 MG tablet Take 5 mg by mouth daily.  . [DISCONTINUED] EPINEPHrine (EPIPEN 2-PAK) 0.3 mg/0.3 mL IJ SOAJ injection Inject 0.3 mLs (0.3 mg total) into the muscle once.  . [DISCONTINUED] hydrochlorothiazide (HYDRODIURIL) 25 MG tablet Take 25 mg by mouth daily.  . [DISCONTINUED] lisinopril (PRINIVIL,ZESTRIL) 40 MG tablet Take 40 mg by mouth daily.   No facility-administered encounter medications on file as of 08/29/2017.      Drug Allergies:  Allergies  Allergen Reactions  . Aspirin Other (See Comments)    asthma  . Erythromycin Rash    Family History: Joell's family history includes Allergic rhinitis in his mother and son; Cancer in his mother; Heart attack in his father; Heart disease in his father... His daughter has food allergies There is no family history of asthma , angioedema, eczema, hives, chronic bronchitis or emphysema.  Social and environmental. He has 2 cats in the  home. He is not exposed to cigarette smoke. He smoked cigarettes socially for less than 10 years. 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 He appears well-developed and well-nourished.  HENT:  Eyes normal. Ears normal. Nose mild swelling of his  nasal  turbinates. Pharynx normal.  Neck: Neck supple. No thyromegaly present.  Cardiovascular:  S1 and S2 normal no murmurs  Pulmonary/Chest:  Clear to percussion and auscultation  Abdominal: Soft. There is no tenderness (no hepatosplenomegaly).  Abdominal hernia  Lymphadenopathy:    He has no cervical adenopathy.  Neurological: He is alert and oriented to person, place, and time.  Skin:  Clear but he had dermographia noted  Psychiatric: He has a normal mood and affect. His behavior is normal. Judgment and thought content normal.  Vitals reviewed.   Diagnostics: FVC 3.43 L FEV1 2.36 L. Predicted FVC 4.09 L predicted FEV1 2.97 L. After albuterol 2 puffs FVC 3.96 L FEV1 2.75 L-the spirometry is in the normal range but the FEV1 did improve 17%  Allergy skin tests were mildly positive to some molds on intradermal testing only. Skin testing to cat was  negative. Skin testing to foods was negative. Broccoli  tests not available  Assessment  Assessment and Plan: 1. Mild persistent asthma without complication   2. Other allergic rhinitis   3. Obstructive sleep apnea treated with continuous positive airway pressure (CPAP)   4. Essential hypertension   5. 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 Aspirin sensitive asthma     Meds ordered this encounter  Medications  . montelukast (SINGULAIR) 10 MG tablet    Sig: Take 1 tablet once a day for coughing or wheezing.    Dispense:  34 tablet    Refill:  5  . albuterol (VENTOLIN HFA) 108 (90 Base) MCG/ACT inhaler    Sig: Inhale 2 puffs into the lungs every 4 (four) hours as needed for wheezing or shortness of breath.    Dispense:  1 Inhaler    Refill:  1    Patient Instructions  Environmental control of dust and mold Zyrtec 10 mg once a day for runny nose or itchy eyes or itching Rhinocort 2 sprays per nostril once a day for stuffy nose Montelukast  10 mg-take 1 tablet once a day for coughing or  wheezing Ventolin 2 puffs every 4 hours if needed for wheezing or coughing spells Add prednisone 10 mg twice a day for 4 days, 10 mg on the fifth day to bring your  cough under control Call me if you're not doing well on this treatment plan Get allergy injections every 3-4 weeks to see if your  symptoms are worse just before the next injection . This will help [place] determine if you need to continue injections You  should have a flu vaccination With his aspirin sensitive asthma, and if he needs something for his arthritis, we could consider a challenge with meloxicam  Avoid broccoli, mushrooms and grapes. If you have an allergic reaction take Benadryl 50 mg every 4 hours  and if you have life-threatening symptoms inject with EpiPen 0.3 mg See if foods with salicylates make  you itch   Return in about 3 months (around 11/29/2017).   Thank you for the opportunity to care for this patient.  Please do not hesitate to contact me with questions.  Penne Lash, M.D.  [facility] [address] [place], [place] [address] [phone]

## 2017-08-29 NOTE — Patient Instructions (Addendum)
Environmental control of dust and mold Zyrtec 10 mg once a day for runny nose or itchy eyes or itching Rhinocort 2 sprays per nostril once a day for stuffy nose Montelukast  10 mg-take 1 tablet once a day for coughing or wheezing Ventolin 2 puffs every 4 hours if needed for wheezing or coughing spells Add prednisone 10 mg twice a day for 4 days, 10 mg on the fifth day to bring your  cough under control Call me if you're not doing well on this treatment plan Get allergy injections every 3-4 weeks to see if your  symptoms are worse just before the next injection . This will help [place] determine if you need to continue injections You  should have a flu vaccination With his aspirin sensitive asthma, and if he needs something for his arthritis, we could consider a challenge with meloxicam  Avoid broccoli, mushrooms and grapes. If you have an allergic reaction take Benadryl 50 mg every 4 hours and if you have life-threatening symptoms inject with EpiPen 0.3 mg See if foods with salicylates make  you itch

## 2018-06-22 ENCOUNTER — Ambulatory Visit
Admission: RE | Admit: 2018-06-22 | Discharge: 2018-06-22 | Disposition: A | Discharge status: Home / Self Care | Payer: Medicare Other | Source: Ambulatory Visit | Attending: Internal Medicine | Admitting: Internal Medicine

## 2018-06-22 ENCOUNTER — Other Ambulatory Visit: Payer: Self-pay | Admitting: Internal Medicine

## 2018-06-22 DIAGNOSIS — Q2546 Tortuous aortic arch: Secondary | ICD-10-CM

## 2018-06-22 DIAGNOSIS — R9389 Abnormal findings on diagnostic imaging of other specified body structures: Secondary | ICD-10-CM

## 2018-06-22 DIAGNOSIS — R0789 Other chest pain: Secondary | ICD-10-CM

## 2018-06-22 MED ORDER — IOPAMIDOL (ISOVUE-370) INJECTION 76%
75.0000 mL | Freq: Once | INTRAVENOUS | Status: AC | PRN
  Administered 2018-06-22: 75 mL via INTRAVENOUS

## 2018-06-26 ENCOUNTER — Telehealth: Payer: Self-pay | Admitting: *Deleted

## 2018-06-26 NOTE — Telephone Encounter (Signed)
Prescription refill denied for Montelukast, patient needs an office visit for future refills.

## 2018-08-28 ENCOUNTER — Other Ambulatory Visit: Payer: Self-pay | Admitting: Pediatrics

## 2018-08-28 NOTE — Telephone Encounter (Signed)
Denied refill Proventil.  Per chart 08/29/2017, Return in about 3 months (around 11/29/2017). Refills were denied on 06/26/18 and patient told to make OV.

## 2018-10-19 ENCOUNTER — Telehealth (HOSPITAL_COMMUNITY): Payer: Self-pay | Admitting: *Deleted

## 2018-10-19 NOTE — Telephone Encounter (Signed)
No answer. Left VM 

## 2018-11-16 ENCOUNTER — Ambulatory Visit (HOSPITAL_COMMUNITY): Payer: Medicare Other

## 2018-11-28 ENCOUNTER — Other Ambulatory Visit (HOSPITAL_COMMUNITY): Payer: Self-pay | Admitting: Internal Medicine

## 2018-11-28 DIAGNOSIS — I714 Abdominal aortic aneurysm, without rupture, unspecified: Secondary | ICD-10-CM

## 2018-11-30 ENCOUNTER — Ambulatory Visit (HOSPITAL_COMMUNITY)
Admission: RE | Admit: 2018-11-30 | Discharge: 2018-11-30 | Disposition: A | Discharge status: Home / Self Care | Payer: Medicare Other | Source: Ambulatory Visit | Attending: Family | Admitting: Family

## 2018-11-30 DIAGNOSIS — I714 Abdominal aortic aneurysm, without rupture, unspecified: Secondary | ICD-10-CM

## 2018-12-19 ENCOUNTER — Ambulatory Visit (INDEPENDENT_AMBULATORY_CARE_PROVIDER_SITE_OTHER): Payer: Medicare Other | Admitting: Physician Assistant

## 2018-12-19 ENCOUNTER — Encounter: Payer: Self-pay | Admitting: Family

## 2018-12-19 ENCOUNTER — Other Ambulatory Visit: Payer: Self-pay

## 2018-12-19 DIAGNOSIS — I714 Abdominal aortic aneurysm, without rupture, unspecified: Secondary | ICD-10-CM | POA: Insufficient documentation

## 2018-12-19 NOTE — Progress Notes (Signed)
    Established Abdominal Aortic Aneurysm   History of Present Illness   Grant Edwards is a 78 y.o. (November 13, 1940) male who presents with chief complaint: follow up for AAA.  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  He denies any new or changing abdominal or back pain.  He also denies any claudication, tissue changes, or rest pain.  He denies tobacco use.  Previous measurement of AAA 2 years ago was 3 cm.  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  The patient's PMH, PSH, SH, and FamHx were reviewed and are unchanged from prior visit.  Current Outpatient Medications  Medication Sig Dispense Refill  . albuterol (VENTOLIN HFA) 108 (90 Base) MCG/ACT inhaler Inhale 2 puffs into the lungs every 4 (four) hours as needed for wheezing or shortness of breath. 1 Inhaler 1  . amLODipine (NORVASC) 5 MG tablet Take 5 mg by mouth.    . EPINEPHrine 0.3 mg/0.3 mL IJ SOAJ injection Inject 0.3 mg into the muscle.    . hydrochlorothiazide (HYDRODIURIL) 25 MG tablet Take 25 mg by mouth.    Marland Kitchen lisinopril (PRINIVIL,ZESTRIL) 40 MG tablet Take 40 mg by mouth.    . montelukast (SINGULAIR) 10 MG tablet Take 1 tablet once a day for coughing or wheezing. 34 tablet 5   No current facility-administered medications for this visit.     On ROS today: 10 system ROS is negative unless otherwise noted in HPI   Physical Examination   Vitals:   12/19/18 1503  BP: 134/75  Pulse: 75  Resp: 17  Temp: (!) 97.1 F (36.2 C)  TempSrc: Oral  SpO2: 97%  Weight: 247 lb (112 kg)  Height: 5' 11.5" (1.816 m)   Body mass index is 33.97 kg/m.  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 Carlis Abbott

## 2019-01-03 ENCOUNTER — Encounter: Payer: Self-pay | Admitting: Internal Medicine

## 2019-02-06 ENCOUNTER — Ambulatory Visit: Payer: Self-pay | Admitting: Cardiology

## 2019-04-26 ENCOUNTER — Other Ambulatory Visit: Payer: Self-pay | Admitting: Internal Medicine

## 2019-04-26 DIAGNOSIS — I712 Thoracic aortic aneurysm, without rupture, unspecified: Secondary | ICD-10-CM

## 2019-05-17 ENCOUNTER — Ambulatory Visit
Admission: RE | Admit: 2019-05-17 | Discharge: 2019-05-17 | Disposition: A | Discharge status: Home / Self Care | Payer: Medicare Other | Source: Ambulatory Visit | Attending: Internal Medicine | Admitting: Internal Medicine

## 2019-05-17 ENCOUNTER — Other Ambulatory Visit: Payer: Self-pay

## 2019-05-17 DIAGNOSIS — I712 Thoracic aortic aneurysm, without rupture, unspecified: Secondary | ICD-10-CM

## 2019-05-31 ENCOUNTER — Ambulatory Visit
Admission: RE | Admit: 2019-05-31 | Discharge: 2019-05-31 | Disposition: A | Discharge status: Home / Self Care | Payer: Medicare Other | Source: Ambulatory Visit | Attending: Internal Medicine | Admitting: Internal Medicine

## 2019-05-31 MED ORDER — IOPAMIDOL (ISOVUE-370) INJECTION 76%
75.0000 mL | Freq: Once | INTRAVENOUS | Status: AC | PRN
  Administered 2019-05-31: 75 mL via INTRAVENOUS

## 2019-06-01 ENCOUNTER — Ambulatory Visit: Payer: Self-pay | Admitting: Cardiology

## 2019-07-20 ENCOUNTER — Ambulatory Visit: Payer: Self-pay | Admitting: Cardiology

## 2019-08-17 ENCOUNTER — Ambulatory Visit: Payer: Self-pay | Admitting: Cardiology

## 2019-12-10 ENCOUNTER — Telehealth: Payer: Self-pay

## 2019-12-10 NOTE — Telephone Encounter (Signed)
ROI fax to [facility] for patient records.

## 2019-12-13 NOTE — Telephone Encounter (Signed)
Rec'd from [employer] forwarded 10 pages to [facility] Provider Historical

## 2020-01-29 ENCOUNTER — Telehealth: Payer: Self-pay | Admitting: Internal Medicine

## 2020-01-29 NOTE — Telephone Encounter (Signed)
Hi Dr. Carlean Purl,  We have received a referral from patient's PCP for a repeat colon. Patient had colonoscopy in 2010 and 2015 records have been received and they will be sent to you for review.  Please advise on scheduling.  Thank you

## 2020-01-31 ENCOUNTER — Encounter: Payer: Self-pay | Admitting: Internal Medicine

## 2020-01-31 NOTE — Telephone Encounter (Signed)
OK to schedule colonoscopy for hx adenomatous colon polyps 

## 2020-02-01 ENCOUNTER — Encounter: Payer: Self-pay | Admitting: Internal Medicine

## 2020-02-26 ENCOUNTER — Other Ambulatory Visit: Payer: Self-pay

## 2020-02-26 ENCOUNTER — Ambulatory Visit (AMBULATORY_SURGERY_CENTER): Payer: Self-pay | Admitting: *Deleted

## 2020-02-26 VITALS — Temp 97.3°F | Ht 71.5 in | Wt 241.0 lb

## 2020-02-26 DIAGNOSIS — Z86010 Personal history of colonic polyps: Secondary | ICD-10-CM

## 2020-02-26 NOTE — Progress Notes (Signed)
Patient is here in-person for PV. Patient denies any allergies to eggs or soy. Patient denies any problems with anesthesia/sedation. Patient denies any oxygen use at home. Patient denies taking any diet/weight loss medications or blood thinners. Patient is not being treated for MRSA or C-diff. EMMI education assisgned to the patient for the procedure, this was explained and instructions given to patient.   Patient is aware of our care-partner policy and 0000000 safety protocol.   Pt has completed covid vaccines, 1st dose on 12/05/19 and 2nd dose on 12/26/19.

## 2020-03-11 ENCOUNTER — Other Ambulatory Visit: Payer: Self-pay

## 2020-03-11 ENCOUNTER — Ambulatory Visit (AMBULATORY_SURGERY_CENTER): Payer: Medicare Other | Admitting: Internal Medicine

## 2020-03-11 ENCOUNTER — Encounter: Payer: Self-pay | Admitting: Internal Medicine

## 2020-03-11 VITALS — BP 115/62 | HR 59 | Temp 96.9°F | Resp 20 | Ht 71.5 in | Wt 241.0 lb

## 2020-03-11 DIAGNOSIS — Z86010 Personal history of colonic polyps: Secondary | ICD-10-CM

## 2020-03-11 DIAGNOSIS — D123 Benign neoplasm of transverse colon: Secondary | ICD-10-CM

## 2020-03-11 MED ORDER — SODIUM CHLORIDE 0.9 % IV SOLN: 500.0000 mL | Freq: Once | INTRAVENOUS | Status: DC

## 2020-03-11 NOTE — Op Note (Signed)
Grant Edwards Patient Name: Grant Edwards Procedure Date: 03/11/2020 1:33 PM MRN: [id number] Endoscopist: Gatha Mayer , MD Age: 79 Referring MD:  Date of Birth: 09-07-1941 Gender: Male Account #: [id number] Procedure:                Colonoscopy Indications:              Surveillance: Personal history of adenomatous                            polyps on last colonoscopy 5 years ago Medicines:                Propofol per Anesthesia, Monitored Anesthesia Care Procedure:                Pre-Anesthesia Assessment:                           - Prior to the procedure, a History and Physical                            was performed, and patient medications and                            allergies were reviewed. The patient's tolerance of                            previous anesthesia was also reviewed. The risks                            and benefits of the procedure and the sedation                            options and risks were discussed with the patient.                            All questions were answered, and informed consent                            was obtained. Prior Anticoagulants: The patient has                            taken no previous anticoagulant or antiplatelet                            agents. ASA Grade Assessment: III - A patient with                            severe systemic disease. After reviewing the risks                            and benefits, the patient was deemed in                            satisfactory condition to undergo the procedure.  After obtaining informed consent, the colonoscope                            was passed under direct vision. Throughout the                            procedure, the patient's blood pressure, pulse, and                            oxygen saturations were monitored continuously. The                            Colonoscope was introduced through the anus and   advanced to the the cecum, identified by                            appendiceal orifice and ileocecal valve. The                            colonoscopy was performed with moderate difficulty                            due to a redundant colon. Successful completion of                            the procedure was aided by applying abdominal                            pressure. The quality of the bowel preparation was                            excellent. The bowel preparation used was Miralax                            via split dose instruction. The ileocecal valve,                            appendiceal orifice, and rectum were photographed.                            The patient tolerated the procedure well. Scope In: 1:46:40 PM Scope Out: 2:06:26 PM Scope Withdrawal Time: 0 hours 8 minutes 26 seconds  Total Procedure Duration: 0 hours 19 minutes 46 seconds  Findings:                 The perianal and digital rectal examinations were                            normal. Pertinent negatives include normal prostate                            (size, shape, and consistency).                           A diminutive polyp was  found in the distal                            transverse colon. The polyp was sessile. The polyp                            was removed with a cold snare. Resection and                            retrieval were complete. Verification of patient                            identification for the specimen was done. Estimated                            blood loss was minimal.                           Multiple diverticula were found in the sigmoid                            colon.                           Internal hemorrhoids were found during retroflexion.                           The exam was otherwise without abnormality on                            direct and retroflexion views. Complications:            No immediate complications. Estimated Blood Loss:     Estimated  blood loss was minimal. Impression:               - One diminutive polyp in the distal transverse                            colon, removed with a cold snare. Resected and                            retrieved.                           - Diverticulosis in the sigmoid colon.                           - Internal hemorrhoids.                           - The examination was otherwise normal on direct                            and retroflexion views.                           - Personal history of colonic polyps.  1 adenoma                            2015, 4 adenomas 2010 all diminutive and others                            pre-2101 also Recommendation:           - Patient has a contact number available for                            emergencies. The signs and symptoms of potential                            delayed complications were discussed with the                            patient. Return to normal activities tomorrow.                            Written discharge instructions were provided to the                            patient.                           - Resume previous diet.                           - Continue present medications.                           - No repeat colonoscopy due to age.                           - Await pathology results. Gatha Mayer, MD 03/11/2020 2:18:06 PM This report has been signed electronically.

## 2020-03-11 NOTE — Patient Instructions (Addendum)
I found and removed one tiny polyp. You also have a condition called diverticulosis - common and not usually a problem. Please read the handout provided. Hemorrhoids were slightly enlarged also - likely from the prep.  I appreciate the opportunity to care for you. Gatha Mayer, MD, FACG    YOU HAD AN ENDOSCOPIC PROCEDURE TODAY AT [facility]:   Refer to the procedure report that was given to you for any specific questions about what was found during the examination.  If the procedure report does not answer your questions, please call your gastroenterologist to clarify.  If you requested that your care partner not be given the details of your procedure findings, then the procedure report has been included in a sealed envelope for you to review at your convenience later.  YOU SHOULD EXPECT: Some feelings of bloating in the abdomen. Passage of more gas than usual.  Walking can help get rid of the air that was put into your GI tract during the procedure and reduce the bloating. If you had a lower endoscopy (such as a colonoscopy or flexible sigmoidoscopy) you may notice spotting of blood in your stool or on the toilet paper. If you underwent a bowel prep for your procedure, you may not have a normal bowel movement for a few days.  Please Note:  You might notice some irritation and congestion in your nose or some drainage.  This is from the oxygen used during your procedure.  There is no need for concern and it should clear up in a day or so.  SYMPTOMS TO REPORT IMMEDIATELY:   Following lower endoscopy (colonoscopy or flexible sigmoidoscopy):  Excessive amounts of blood in the stool  Significant tenderness or worsening of abdominal pains  Swelling of the abdomen that is new, acute  Fever of 100F or higher    For urgent or emergent issues, a gastroenterologist can be reached at any hour by calling [phone]. Do not use MyChart messaging for urgent concerns.    DIET:   We do recommend a small meal at first, but then you may proceed to your regular diet.  Drink plenty of fluids but you should avoid alcoholic beverages for 24 hours.  ACTIVITY:  You should plan to take it easy for the rest of today and you should NOT DRIVE or use heavy machinery until tomorrow (because of the sedation medicines used during the test).    FOLLOW UP: Our staff will call the number listed on your records 48-72 hours following your procedure to check on you and address any questions or concerns that you may have regarding the information given to you following your procedure. If we do not reach you, we will leave a message.  We will attempt to reach you two times.  During this call, we will ask if you have developed any symptoms of COVID 19. If you develop any symptoms (ie: fever, flu-like symptoms, shortness of breath, cough etc.) before then, please call [phone].  If you test positive for Covid 19 in the 2 weeks post procedure, please call and report this information to [place].    If any biopsies were taken you will be contacted by phone or by letter within the next 1-3 weeks.  Please call us at [phone] if you have not heard about the biopsies in 3 weeks.    SIGNATURES/CONFIDENTIALITY: You and/or your care partner have signed paperwork which will be entered into your electronic medical record.  These signatures  attest to the fact that that the information above on your After Visit Summary has been reviewed and is understood.  Full responsibility of the confidentiality of this discharge information lies with you and/or your care-partner.   Resume medications. Information given on polyps,diverticulosis and hemorrhoids.

## 2020-03-11 NOTE — Progress Notes (Signed)
To PACU< VSS. Report to [email or web address] 

## 2020-03-11 NOTE — Progress Notes (Signed)
Pt's states no medical or surgical changes since previsit or office visit.  Jonnie Kind, VS DT

## 2020-03-11 NOTE — Progress Notes (Signed)
Called to room to assist during endoscopic procedure.  Patient ID and intended procedure confirmed with present staff. Received instructions for my participation in the procedure from the performing physician.  

## 2020-03-13 ENCOUNTER — Telehealth: Payer: Self-pay

## 2020-03-13 NOTE — Telephone Encounter (Signed)
  Follow up Call-  Call back number 03/11/2020  Post procedure Call Back phone  # [phone]  Permission to leave phone message Yes  Some recent data might be hidden     Patient questions:  Do you have a fever, pain , or abdominal swelling? No. Pain Score  0 *  Have you tolerated food without any problems? Yes.    Have you been able to return to your normal activities? Yes.    Do you have any questions about your discharge instructions: Diet   No. Medications  No. Follow up visit  No.  Do you have questions or concerns about your Care? No.  Actions: * If pain score is 4 or above: 1. No action needed, pain <4.Have you developed a fever since your procedure? no  2.   Have you had an respiratory symptoms (SOB or cough) since your procedure? no  3.   Have you tested positive for COVID 19 since your procedure no  4.   Have you had any family members/close contacts diagnosed with the COVID 19 since your procedure?  no   If yes to any of these questions please route to Joylene John, RN and Erenest Rasher, RN

## 2020-03-18 ENCOUNTER — Encounter: Payer: Self-pay | Admitting: Internal Medicine

## 2022-01-28 NOTE — Patient Instructions (Addendum)
DUE TO COVID-19 ONLY ONE VISITOR  (aged 79 and older)  IS ALLOWED TO COME WITH YOU AND STAY IN THE WAITING ROOM ONLY DURING PRE OP AND PROCEDURE.   ?**NO VISITORS ARE ALLOWED IN THE SHORT STAY AREA OR RECOVERY ROOM!!** ? ?IF YOU WILL BE ADMITTED INTO THE HOSPITAL YOU ARE ALLOWED ONLY TWO SUPPORT PEOPLE DURING VISITATION HOURS ONLY (7 AM -8PM)   ?The support person(s) must pass our screening, gel in and out, and wear a mask at all times, including in the patient?s room. ?Patients must also wear a mask when staff or their support person are in the room. ?Visitors GUEST BADGE MUST BE WORN VISIBLY  ?One adult visitor may remain with you overnight and MUST be in the room by 8 P.M. ?  ? ? Your procedure is scheduled on: 02-22-22 ? ? Report to [facility] Main Entrance ? ?  Report to admitting at      0640 AM ? ? Call this number if you have problems the morning of surgery [phone] ? ? Do not eat food :After Midnight. ? ? After Midnight you may have the following liquids until __0625____ AM/ DAY OF SURGERY ? ?Water ?Black Coffee (sugar ok, NO MILK/CREAM OR CREAMERS)  ?Tea (sugar ok, NO MILK/CREAM OR CREAMERS) regular and decaf                             ?Plain Jell-O (NO RED)                                           ?Fruit ices (not with fruit pulp, NO RED)                                     ?Popsicles (NO RED)                                                                  ?Juice: apple, WHITE grape, WHITE cranberry ?Sports drinks like Gatorade (NO RED) ?Clear broth(vegetable,chicken,beef) ? ?             ? ? ?  ?  ?The day of surgery:  ?Drink ONE (1) Pre-Surgery  clear ensure at      0620 AM the morning of surgery. Drink in one sitting. Do not sip.  ?This drink was given to you during your hospital  ?pre-op appointment visit. ?Nothing else to drink after completing the  ?Pre-Surgery Clear Ensure  ?  ?       If you have questions, please contact your surgeon?s office. ? ? ?  ?  ?Oral Hygiene is also  important to reduce your risk of infection.                                    ?Remember - BRUSH YOUR TEETH THE MORNING OF SURGERY WITH YOUR REGULAR TOOTHPASTE ? ? Do NOT smoke after Midnight ? ? Take these medicines the morning  of surgery with A SIP OF WATER: metoprolol, ezetimibe( Zetia) , allopurinol, inhaler bring it with you, atorvastatin ? ? ?Bring CPAP mask and tubing day of surgery. ?                  ?           You may not have any metal on your body including hair pins, jewelry, and body piercing ? ?           Do not wear  lotions, powders, perfumes/cologne, or deodorant ? ? ?            Men may shave face and neck. ? ? Do not bring valuables to the hospital. [facility] NOT ?            RESPONSIBLE   FOR VALUABLES. ? ? Contacts, dentures or bridgework may not be worn into surgery. ? ? Bring small overnight bag day of surgery. ?  ? Patients discharged on the day of surgery will not be allowed to drive home.  Someone NEEDS to stay with you for the first 24 hours after anesthesia. ? ? Special Instructions: Bring a copy of your healthcare power of attorney and living will documents         the day of surgery if you haven't scanned them before. ? ?            Please read over the following fact sheets you were given: IF [place] [phone] ? ?    - Preparing for Surgery ?Before surgery, you can play an important role.  Because skin is not sterile, your skin needs to be as free of germs as possible.  You can reduce the number of germs on your skin by washing with CHG (chlorahexidine gluconate) soap before surgery.  CHG is an antiseptic cleaner which kills germs and bonds with the skin to continue killing germs even after washing. ?Please DO NOT use if you have an allergy to CHG or antibacterial soaps.  If your skin becomes reddened/irritated stop using the CHG and inform your nurse when you arrive at Short Stay. ?Do not shave (including legs  and underarms) for at least 48 hours prior to the first CHG shower.  You may shave your face/neck. ?Please follow these instructions carefully: ? 1.  Shower with CHG Soap the night before surgery and the  morning of Surgery. ? 2.  If you choose to wash your hair, wash your hair first as usual with your  normal  shampoo. ? 3.  After you shampoo, rinse your hair and body thoroughly to remove the  shampoo.                           4.  Use CHG as you would any other liquid soap.  You can apply chg directly  to the skin and wash  ?                     Gently with a scrungie or clean washcloth. ? 5.  Apply the CHG Soap to your body ONLY FROM THE NECK DOWN.   Do not use on face/ open      ?                     Wound or open sores. Avoid contact with eyes, ears mouth and genitals (  private parts).  ?                     Production manager,  Genitals (private parts) with your normal soap. ?            6.  Wash thoroughly, paying special attention to the area where your surgery  will be performed. ? 7.  Thoroughly rinse your body with warm water from the neck down. ? 8.  DO NOT shower/wash with your normal soap after using and rinsing off  the CHG Soap. ?               9.  Pat yourself dry with a clean towel. ?           10.  Wear clean pajamas. ?           11.  Place clean sheets on your bed the night of your first shower and do not  sleep with pets. ?Day of Surgery : ?Do not apply any lotions/deodorants the morning of surgery.  Please wear clean clothes to the hospital/surgery center. ? ?FAILURE TO FOLLOW THESE INSTRUCTIONS MAY RESULT IN THE CANCELLATION OF YOUR SURGERY ?PATIENT SIGNATURE_________________________________ ? ?NURSE SIGNATURE__________________________________ ? ?________________________________________________________________________  ? ?Incentive Spirometer ? ?An incentive spirometer is a tool that can help keep your lungs clear and active. This tool measures how well you are filling your lungs with each breath.  Taking long deep breaths may help reverse or decrease the chance of developing breathing (pulmonary) problems (especially infection) following: ?A long period of time when you are unable to move or be active. ?BEFORE THE PROCEDURE  ?If the spirometer includes an indicator to show your best effort, your nurse or respiratory therapist will set it to a desired goal. ?If possible, sit up straight or lean slightly forward. Try not to slouch. ?Hold the incentive spirometer in an upright position. ?INSTRUCTIONS FOR USE  ?Sit on the edge of your bed if possible, or sit up as far as you can in bed or on a chair. ?Hold the incentive spirometer in an upright position. ?Breathe out normally. ?Place the mouthpiece in your mouth and seal your lips tightly around it. ?Breathe in slowly and as deeply as possible, raising the piston or the ball toward the top of the column. ?Hold your breath for 3-5 seconds or for as long as possible. Allow the piston or ball to fall to the bottom of the column. ?Remove the mouthpiece from your mouth and breathe out normally. ?Rest for a few seconds and repeat Steps 1 through 7 at least 10 times every 1-2 hours when you are awake. Take your time and take a few normal breaths between deep breaths. ?The spirometer may include an indicator to show your best effort. Use the indicator as a goal to work toward during each repetition. ?After each set of 10 deep breaths, practice coughing to be sure your lungs are clear. If you have an incision (the cut made at the time of surgery), support your incision when coughing by placing a pillow or rolled up towels firmly against it. ?Once you are able to get out of bed, walk around indoors and cough well. You may stop using the incentive spirometer when instructed by your caregiver.  ?RISKS AND COMPLICATIONS ?Take your time so you do not get dizzy or light-headed. ?If you are in pain, you may need to take or ask for pain medication before doing incentive  spirometry. It is harder to  take a deep breath if you are having pain. ?AFTER USE ?Rest and breathe slowly and easily. ?It can be helpful to keep track of a log of your progress. Your caregiver can provide you with a sim

## 2022-01-28 NOTE — Progress Notes (Addendum)
PCP - Tiana Loft , MD  ?Cardiologist - Cassell Clement Loni Beckwith 01-08-22  Clearance form 10-19-21 on chart ?ED visit 08-14-21 on chart  ? ?PPM/ICD -  ?Device Orders -  ?Rep Notified -  ? ?CTA chest/ abd- 05-21-21 CE ?Chest x-ray - 08-14-21 ON chart ?EKG - 08-14-21 CE ?Stress Test - 05-27-21 CE ?ECHO - TEE 2021 CE ?Cardiac Cath -  ? ?Sleep Study -  ?CPAP -  ? ?Fasting Blood Sugar -  ?Checks Blood Sugar _____ times a day ? ?Blood Thinner Instructions: Plavix  Hold 5 days ?Aspirin Instructions: ? ?ERAS Protcol - ?PRE-SURGERY Ensure  ? ? ?COVID vaccine -x3 pfizer ? ?Activity--Able to walk a flight of stairs without SOB or Cp ?Anesthesia review: CAD/stent 06-2021, OSA,  descending and abdominal AAA, HTN ? ?Patient denies shortness of breath, fever, cough and chest pain at PAT appointment ? ? ?All instructions explained to the patient, with a verbal understanding of the material. Patient agrees to go over the instructions while at home for a better understanding. Patient also instructed to self quarantine after being tested for COVID-19. The opportunity to ask questions was provided. ?  ?

## 2022-02-09 ENCOUNTER — Other Ambulatory Visit: Payer: Self-pay

## 2022-02-09 ENCOUNTER — Encounter (HOSPITAL_COMMUNITY): Payer: Self-pay

## 2022-02-09 ENCOUNTER — Encounter (HOSPITAL_COMMUNITY)
Admission: RE | Admit: 2022-02-09 | Discharge: 2022-02-09 | Disposition: A | Discharge status: Home / Self Care | Payer: Medicare Other | Source: Ambulatory Visit | Attending: Orthopedic Surgery | Admitting: Orthopedic Surgery

## 2022-02-09 VITALS — BP 143/81 | HR 60 | Temp 97.9°F | Resp 18 | Ht 71.5 in | Wt 237.0 lb

## 2022-02-09 DIAGNOSIS — I10 Essential (primary) hypertension: Secondary | ICD-10-CM | POA: Diagnosis not present

## 2022-02-09 DIAGNOSIS — J45909 Unspecified asthma, uncomplicated: Secondary | ICD-10-CM | POA: Diagnosis not present

## 2022-02-09 DIAGNOSIS — Z01812 Encounter for preprocedural laboratory examination: Secondary | ICD-10-CM | POA: Insufficient documentation

## 2022-02-09 DIAGNOSIS — I2510 Atherosclerotic heart disease of native coronary artery without angina pectoris: Secondary | ICD-10-CM | POA: Diagnosis not present

## 2022-02-09 DIAGNOSIS — G4730 Sleep apnea, unspecified: Secondary | ICD-10-CM | POA: Diagnosis not present

## 2022-02-09 DIAGNOSIS — Z01818 Encounter for other preprocedural examination: Secondary | ICD-10-CM

## 2022-02-09 DIAGNOSIS — Z9989 Dependence on other enabling machines and devices: Secondary | ICD-10-CM | POA: Insufficient documentation

## 2022-02-09 DIAGNOSIS — M1711 Unilateral primary osteoarthritis, right knee: Secondary | ICD-10-CM | POA: Diagnosis not present

## 2022-02-09 HISTORY — DX: Unspecified macular degeneration: H35.30

## 2022-02-09 HISTORY — DX: Sleep apnea, unspecified: G47.30

## 2022-02-09 HISTORY — DX: Personal history of urinary calculi: Z87.442

## 2022-02-09 HISTORY — DX: Pneumonia, unspecified organism: J18.9

## 2022-02-09 HISTORY — DX: Claustrophobia: F40.240

## 2022-02-09 HISTORY — DX: Atherosclerotic heart disease of native coronary artery without angina pectoris: I25.10

## 2022-02-09 LAB — COMPREHENSIVE METABOLIC PANEL
ALT: 13 U/L (ref 0–44)
AST: 22 U/L (ref 15–41)
Albumin: 4.1 g/dL (ref 3.5–5.0)
Alkaline Phosphatase: 61 U/L (ref 38–126)
Anion gap: 6 (ref 5–15)
BUN: 18 mg/dL (ref 8–23)
CO2: 26 mmol/L (ref 22–32)
Calcium: 9.5 mg/dL (ref 8.9–10.3)
Chloride: 109 mmol/L (ref 98–111)
Creatinine, Ser: 1.05 mg/dL (ref 0.61–1.24)
GFR, Estimated: >60 mL/min (ref >60)
Glucose, Bld: 98 mg/dL (ref 70–99)
Potassium: 4.2 mmol/L (ref 3.5–5.1)
Sodium: 141 mmol/L (ref 135–145)
Total Bilirubin: 0.8 mg/dL (ref 0.3–1.2)
Total Protein: 7.2 g/dL (ref 6.5–8.1)

## 2022-02-09 LAB — CBC
HCT: 41.9 % (ref 39.0–52.0)
Hemoglobin: 13.7 g/dL (ref 13.0–17.0)
MCH: 29.3 pg (ref 26.0–34.0)
MCHC: 32.7 g/dL (ref 30.0–36.0)
MCV: 89.5 fL (ref 80.0–100.0)
Platelets: 208 10*3/uL (ref 150–400)
RBC: 4.68 MIL/uL (ref 4.22–5.81)
RDW: 15.2 % (ref 11.5–15.5)
WBC: 5.3 10*3/uL (ref 4.0–10.5)
nRBC: 0 % (ref 0.0–0.2)

## 2022-02-09 LAB — SURGICAL PCR SCREEN
MRSA, PCR: NEGATIVE
Staphylococcus aureus: NEGATIVE

## 2022-02-10 NOTE — Progress Notes (Signed)
Anesthesia Chart Review ? ? Case: [id number] Date/Time: 02/22/22 0910  ? Procedure: TOTAL KNEE ARTHROPLASTY (Right: Knee)  ? Anesthesia type: Choice  ? Pre-op diagnosis: right knee osteoarthritis  ? Location: [facility] ROOM 09 / [facility]  ? Surgeons: Gaynelle Arabian, MD  ? ?  ? ? ?DISCUSSION:81 y.o. never smoker with h/o HTN, AAA, sleep apnea, CAD (stent 06/2021), asthma, right knee OA scheduled for above procedure 02/22/2022 with Dr. Gaynelle Arabian.  ? ?Pt advised to hold Plavix 5 days.   ? ?Last seen by cardiology 12/03/2021.  Stable at this visit.  He is greater than 6 months out from recent stent, ok to hold Plavix at this time.  ? ?Anticipate pt can proceed with planned procedure barring acute status change.   ?VS: BP (!) 143/81   Pulse 60   Temp 36.6 ?C (Oral)   Resp 18   Ht 5' 11.5" (1.816 m)   Wt 107.5 kg   SpO2 98%   BMI 32.59 kg/m?  ? ?PROVIDERS: ?Lilian Coma., MD is PCP  ? ?Loni Beckwith, MD is Cardiologist  ?LABS: Labs reviewed: Acceptable for surgery. ?(all labs ordered are listed, but only abnormal results are displayed) ? ?Labs Reviewed  ?SURGICAL PCR SCREEN  ?CBC  ?COMPREHENSIVE METABOLIC PANEL  ? ? ? ?IMAGES: ? ? ?EKG: ? ? ?CV: ? ?Past Medical History:  ?Diagnosis Date  ? AAA (abdominal aortic aneurysm) ([place])   ? abdominal and Acending Aortic   ? Arthritis   ? Asthma   ? PRN inhaler  ? Claustrophobia   ? Coronary artery disease   ? Eczema   ? Gout   ? History of kidney stones   ? Hx of adenomatous colonic polyps   ? Hyperlipidemia   ? Hypertension   ? Macular degeneration   ? Pneumonia   ? 81 years old  ? Sleep apnea   ? CPAP  13 setting  ? ? ?Past Surgical History:  ?Procedure Laterality Date  ? COLONOSCOPY W/ POLYPECTOMY  last 2015  ? multiple  ? knee intercompartmental replacement  2008  ? Dr. Noemi Chapel   ? POLYPECTOMY    ? RETINAL LASER PROCEDURE  2010  ? RHINOPLASTY  1976  ? TONSILLECTOMY    ? ? ?MEDICATIONS: ? albuterol (VENTOLIN HFA) 108 (90 Base) MCG/ACT inhaler  ? allopurinol (ZYLOPRIM) 300 MG  tablet  ? atorvastatin (LIPITOR) 20 MG tablet  ? Cholecalciferol (VITAMIN D3) 250 MCG (10000 UT) capsule  ? clopidogrel (PLAVIX) 75 MG tablet  ? colchicine 0.6 MG tablet  ? ezetimibe (ZETIA) 10 MG tablet  ? fluticasone (FLONASE) 50 MCG/ACT nasal spray  ? furosemide (LASIX) 20 MG tablet  ? hydroxypropyl methylcellulose / hypromellose (ISOPTO TEARS / GONIOVISC) 2.5 % ophthalmic solution  ? lisinopril (PRINIVIL,ZESTRIL) 40 MG tablet  ? metoprolol succinate (TOPROL-XL) 25 MG 24 hr tablet  ? Multiple Vitamins-Minerals (PRESERVISION AREDS 2 PO)  ? REPATHA SURECLICK 013 MG/ML SOAJ  ? ?No current facility-administered medications for this encounter.  ? ? ?Konrad Felix Ward, PA-C ?[facility] ?[phone] ? ? ? ? ? ?

## 2022-02-12 ENCOUNTER — Other Ambulatory Visit (HOSPITAL_COMMUNITY): Payer: Medicare Other

## 2022-02-12 NOTE — H&P (Signed)
TOTAL KNEE ADMISSION H&P ? ?Patient is being admitted for right total knee arthroplasty. ? ?Subjective: ? ?Chief Complaint: Right knee pain. ? ?HPI: Grant Edwards, 81 y.o. male has a history of pain and functional disability in the right knee due to arthritis and has failed non-surgical conservative treatments for greater than 12 weeks to include NSAID's and/or analgesics, flexibility and strengthening excercises, and activity modification. Onset of symptoms was gradual, starting  several  years ago with gradually worsening course since that time. The patient noted no past surgery on the right knee.  Patient currently rates pain in the right knee at 7 out of 10 with activity. Patient has worsening of pain with activity and weight bearing, pain that interferes with activities of daily living, pain with passive range of motion, and crepitus. Patient has evidence of periarticular osteophytes and joint space narrowing by imaging studies. There is no active infection. ? ?Patient Active Problem List  ? Diagnosis Date Noted  ? AAA (abdominal aortic aneurysm) without rupture ([place]) 12/19/2018  ? Anaphylactic shock due to adverse food reaction 08/29/2017  ? 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 Essential hypertension 08/29/2017  ? Obstructive sleep apnea treated with continuous positive airway pressure (CPAP) 08/29/2017  ? Other allergic rhinitis 08/29/2017  ? Mild persistent asthma without complication 61/60/7371  ? ? ?Past Medical History:  ?Diagnosis Date  ? AAA (abdominal aortic aneurysm) ([place])   ? abdominal and Acending Aortic   ? Arthritis   ? Asthma   ? PRN inhaler  ? Claustrophobia   ? Coronary artery disease   ? Eczema   ? Gout   ? History of kidney stones   ? Hx of adenomatous colonic polyps   ? Hyperlipidemia   ? Hypertension   ? Macular degeneration   ? Pneumonia   ? 81 years old  ? Sleep apnea   ? CPAP  13 setting  ? ? ?Past Surgical History:  ?Procedure Laterality Date  ? COLONOSCOPY W/ POLYPECTOMY  last 2015  ? multiple   ? knee intercompartmental replacement  2008  ? Dr. Noemi Chapel   ? POLYPECTOMY    ? RETINAL LASER PROCEDURE  2010  ? RHINOPLASTY  1976  ? TONSILLECTOMY    ? ? ?Prior to Admission medications   ?Medication Sig Start Date End Date Taking? Authorizing Provider  ?albuterol (VENTOLIN HFA) 108 (90 Base) MCG/ACT inhaler Inhale 2 puffs into the lungs every 4 (four) hours as needed for wheezing or shortness of breath. 08/29/17  Yes Bardelas, Jens Som, MD  ?allopurinol (ZYLOPRIM) 300 MG tablet Take 300 mg by mouth in the morning. 02/25/20  Yes [provider]  ?atorvastatin (LIPITOR) 20 MG tablet Take 20 mg by mouth in the morning.   Yes [provider]  ?Cholecalciferol (VITAMIN D3) 250 MCG (10000 UT) capsule Take 10,000 Units by mouth every 3 (three) days.   Yes [provider]  ?clopidogrel (PLAVIX) 75 MG tablet Take 75 mg by mouth daily.   Yes [provider]  ?colchicine 0.6 MG tablet Take 0.6-1.2 mg by mouth daily as needed (gout flares.).   Yes [provider]  ?ezetimibe (ZETIA) 10 MG tablet Take 10 mg by mouth in the morning. 01/21/20  Yes [provider]  ?fluticasone (FLONASE) 50 MCG/ACT nasal spray Place 1-2 sprays into both nostrils daily as needed for allergies or rhinitis.   Yes [provider]  ?furosemide (LASIX) 20 MG tablet Take 20 mg by mouth in the morning. 02/25/20  Yes [provider]  ?  hydroxypropyl methylcellulose / hypromellose (ISOPTO TEARS / GONIOVISC) 2.5 % ophthalmic solution Place 1-2 drops into both eyes 3 (three) times daily as needed (dry/irritated eyes.).   Yes [provider]  ?lisinopril (PRINIVIL,ZESTRIL) 40 MG tablet Take 40 mg by mouth in the morning.   Yes [provider]  ?metoprolol succinate (TOPROL-XL) 25 MG 24 hr tablet Take 25 mg by mouth in the morning.   Yes [provider]  ?Multiple Vitamins-Minerals (PRESERVISION AREDS 2 PO) Take 1 tablet by mouth in the morning and at bedtime.   Yes  [provider]  ?REPATHA SURECLICK 845 MG/ML SOAJ Inject 140 mg into the skin every 21 ( twenty-one) days. 02/11/20  Yes [provider]  ? ? ?Allergies  ?Allergen Reactions  ? Aspirin Other (See Comments) and Shortness Of Breath  ?  asthma  ? Azithromycin Rash  ? Nsaids Shortness Of Breath  ?  Renal impact  ? Erythromycin Rash  ? ? ?Social History  ? ?Socioeconomic History  ? Marital status: Married  ?  Spouse name: Not on file  ? Number of children: Not on file  ? Years of education: Not on file  ? Highest education level: Not on file  ?Occupational History  ? Not on file  ?Tobacco Use  ? Smoking status: Never  ? Smokeless tobacco: Never  ?Vaping Use  ? Vaping Use: Never used  ?Substance and Sexual Activity  ? Alcohol use: No  ?  Alcohol/week: 0.0 standard drinks  ? Drug use: No  ? Sexual activity: Not Currently  ?Other Topics Concern  ? Not on file  ?Social History Narrative  ? Not on file  ? ?Social Determinants of Health  ? ?Financial Resource Strain: Not on file  ?Food Insecurity: Not on file  ?Transportation Needs: Not on file  ?Physical Activity: Not on file  ?Stress: Not on file  ?Social Connections: Not on file  ?Intimate Partner Violence: Not on file  ? ? ?Tobacco Use: Low Risk   ? Smoking Tobacco Use: Never  ? Smokeless Tobacco Use: Never  ? Passive Exposure: Not on file  ? ?Social History  ? ?Substance and Sexual Activity  ?Alcohol Use No  ? Alcohol/week: 0.0 standard drinks  ? ? ?Family History  ?Problem Relation Age of Onset  ? Cancer Mother   ? Allergic rhinitis Mother   ? Breast cancer Mother   ? Heart disease Father   ? Heart attack Father   ? Allergic rhinitis Son   ? Angioedema Neg Hx   ? Asthma Neg Hx   ? Eczema Neg Hx   ? Immunodeficiency Neg Hx   ? Urticaria Neg Hx   ? Colon cancer Neg Hx   ? Colon polyps Neg Hx   ? Esophageal cancer Neg Hx   ? Rectal cancer Neg Hx   ? Stomach cancer Neg Hx   ? ? ?ROS: ?Constitutional: no fever, no chills, no night sweats, no significant  weight loss ?Cardiovascular: no chest pain, no palpitations ?Respiratory: no cough, no shortness of breath, No COPD ?Gastrointestinal: no vomiting, no nausea ?Musculoskeletal: no swelling in Joints, Joint Pain ?Neurologic: no numbness, no tingling, no difficulty with balance ? ? ?Objective: ? ?Physical Exam: ?Well nourished and well developed.  ?General: Alert and oriented x3, cooperative and pleasant, no acute distress.  ?Head: normocephalic, atraumatic, neck supple.  ?Eyes: EOMI.  ?Respiratory: breath sounds clear in all fields, no wheezing, rales, or rhonchi. ?Cardiovascular: Regular rate and rhythm, no murmurs, gallops or  rubs.  ?Abdomen: non-tender to palpation and soft, normoactive bowel sounds. ?Musculoskeletal: ? ? Bilateral Hip Exam:  ? The range of motion: normal without discomfort.  ?  ? Right Knee Exam:  ? No effusion present. No swelling present.  ? Varus deformity.  ? The range of motion is: 0 to 125 degrees.  ? Positive crepitus on range of motion of the knee.  ? Positive medial joint line tenderness.  ? No lateral joint line tenderness.  ? The knee is stable.  ?  ? Left Knee Exam:  ? Medial scar from a previous unicompartmental arthroplasty.  ? No effusion present. No swelling present.  ? The Range of motion is: 0 to 125 degrees.  ? Slight crepitus on range of motion of the knee.  ? No medial joint line tenderness.  ? Slight lateral joint line tenderness.  ? The knee is stable. ? ? ?Calves soft and nontender. Motor function intact in LE. Strength 5/5 LE bilaterally. ?Neuro: Distal pulses 2+. Sensation to light touch intact in LE. ? ? ? ?Vital signs in last 24 hours: ?  ? ?Imaging Review ?AP and lateral of the bilateral knees dated 10/16/2021 demonstrate bone-on-bone arthritis in the medial compartment of the right knee with near bone-on-bone arthritis in the patellofemoral compartment. There is slight varus. The left knee unicompartmental arthroplasty is in good position with no abnormalities. He  does have arthritis in the patellofemoral joint on the left. ? ?Assessment/Plan: ? ?End stage arthritis, right knee  ? ?The patient history, physical examination, clinical judgment of the provider and imaging

## 2022-02-22 ENCOUNTER — Encounter (HOSPITAL_COMMUNITY)
Admission: RE | Disposition: A | Discharge status: Home / Self Care | Payer: Self-pay | Source: Ambulatory Visit | Attending: Orthopedic Surgery

## 2022-02-22 ENCOUNTER — Observation Stay (HOSPITAL_COMMUNITY)
Admission: RE | Admit: 2022-02-22 | Discharge: 2022-02-24 | Disposition: A | Discharge status: Home / Self Care | Payer: Medicare Other | Source: Ambulatory Visit | Attending: Orthopedic Surgery | Admitting: Orthopedic Surgery

## 2022-02-22 ENCOUNTER — Other Ambulatory Visit: Payer: Self-pay

## 2022-02-22 ENCOUNTER — Encounter (HOSPITAL_COMMUNITY): Payer: Self-pay | Admitting: Orthopedic Surgery

## 2022-02-22 ENCOUNTER — Ambulatory Visit (HOSPITAL_BASED_OUTPATIENT_CLINIC_OR_DEPARTMENT_OTHER): Payer: Medicare Other | Admitting: Anesthesiology

## 2022-02-22 ENCOUNTER — Ambulatory Visit (HOSPITAL_COMMUNITY): Payer: Medicare Other | Admitting: Physician Assistant

## 2022-02-22 DIAGNOSIS — I10 Essential (primary) hypertension: Secondary | ICD-10-CM | POA: Diagnosis not present

## 2022-02-22 DIAGNOSIS — M1711 Unilateral primary osteoarthritis, right knee: Secondary | ICD-10-CM

## 2022-02-22 DIAGNOSIS — J45909 Unspecified asthma, uncomplicated: Secondary | ICD-10-CM | POA: Diagnosis not present

## 2022-02-22 DIAGNOSIS — F419 Anxiety disorder, unspecified: Secondary | ICD-10-CM

## 2022-02-22 DIAGNOSIS — Z79899 Other long term (current) drug therapy: Secondary | ICD-10-CM | POA: Diagnosis not present

## 2022-02-22 DIAGNOSIS — I2510 Atherosclerotic heart disease of native coronary artery without angina pectoris: Secondary | ICD-10-CM | POA: Diagnosis not present

## 2022-02-22 DIAGNOSIS — M179 Osteoarthritis of knee, unspecified: Secondary | ICD-10-CM | POA: Diagnosis present

## 2022-02-22 HISTORY — PX: TOTAL KNEE ARTHROPLASTY: SHX125

## 2022-02-22 SURGERY — ARTHROPLASTY, KNEE, TOTAL
Anesthesia: Monitor Anesthesia Care | Site: Knee | Laterality: Right

## 2022-02-22 MED ORDER — SODIUM CHLORIDE 0.9 % IV SOLN
INTRAVENOUS | Status: DC
  Administered 2022-02-22: 1000 mL via INTRAVENOUS

## 2022-02-22 MED ORDER — ORAL CARE MOUTH RINSE: 15.0000 mL | Freq: Once | OROMUCOSAL | Status: AC

## 2022-02-22 MED ORDER — EZETIMIBE 10 MG PO TABS
10.0000 mg | ORAL_TABLET | Freq: Every day | ORAL | Status: DC
Start: 2022-02-23 — End: 2022-02-24
  Administered 2022-02-23 – 2022-02-24 (×2): 10 mg via ORAL
  Filled 2022-02-22 (×2): qty 1

## 2022-02-22 MED ORDER — DEXAMETHASONE SODIUM PHOSPHATE 10 MG/ML IJ SOLN
8.0000 mg | Freq: Once | INTRAMUSCULAR | Status: AC
  Administered 2022-02-22: 10 mg via INTRAVENOUS

## 2022-02-22 MED ORDER — METHOCARBAMOL 500 MG IVPB - SIMPLE MED
INTRAVENOUS | Status: AC
  Filled 2022-02-22: qty 50

## 2022-02-22 MED ORDER — ACETAMINOPHEN 500 MG PO TABS: 1000.0000 mg | ORAL_TABLET | Freq: Once | ORAL | Status: DC

## 2022-02-22 MED ORDER — FENTANYL CITRATE PF 50 MCG/ML IJ SOSY
PREFILLED_SYRINGE | INTRAMUSCULAR | Status: AC
  Filled 2022-02-22: qty 2

## 2022-02-22 MED ORDER — FLEET ENEMA 7-19 GM/118ML RE ENEM: 1.0000 | ENEMA | Freq: Once | RECTAL | Status: DC | PRN

## 2022-02-22 MED ORDER — TRAMADOL HCL 50 MG PO TABS
50.0000 mg | ORAL_TABLET | Freq: Four times a day (QID) | ORAL | Status: DC | PRN
  Administered 2022-02-22: 100 mg via ORAL
  Filled 2022-02-22: qty 2

## 2022-02-22 MED ORDER — SODIUM CHLORIDE (PF) 0.9 % IJ SOLN
INTRAMUSCULAR | Status: AC
  Filled 2022-02-22: qty 50

## 2022-02-22 MED ORDER — 0.9 % SODIUM CHLORIDE (POUR BTL) OPTIME
TOPICAL | Status: DC | PRN
  Administered 2022-02-22: 1000 mL

## 2022-02-22 MED ORDER — FENTANYL CITRATE PF 50 MCG/ML IJ SOSY
25.0000 ug | PREFILLED_SYRINGE | INTRAMUSCULAR | Status: DC | PRN
  Administered 2022-02-22: 25 ug via INTRAVENOUS
  Administered 2022-02-22: 50 ug via INTRAVENOUS
  Administered 2022-02-22: 25 ug via INTRAVENOUS

## 2022-02-22 MED ORDER — OXYCODONE HCL 5 MG PO TABS
5.0000 mg | ORAL_TABLET | Freq: Once | ORAL | Status: AC | PRN
  Administered 2022-02-22: 5 mg via ORAL

## 2022-02-22 MED ORDER — BISACODYL 10 MG RE SUPP: 10.0000 mg | Freq: Every day | RECTAL | Status: DC | PRN

## 2022-02-22 MED ORDER — LACTATED RINGERS IV SOLN: INTRAVENOUS | Status: DC

## 2022-02-22 MED ORDER — DOCUSATE SODIUM 100 MG PO CAPS
100.0000 mg | ORAL_CAPSULE | Freq: Two times a day (BID) | ORAL | Status: DC
  Administered 2022-02-22 – 2022-02-24 (×4): 100 mg via ORAL
  Filled 2022-02-22 (×4): qty 1

## 2022-02-22 MED ORDER — PROPOFOL 500 MG/50ML IV EMUL
INTRAVENOUS | Status: AC
  Filled 2022-02-22: qty 50

## 2022-02-22 MED ORDER — PROPOFOL 1000 MG/100ML IV EMUL
INTRAVENOUS | Status: AC
  Filled 2022-02-22: qty 100

## 2022-02-22 MED ORDER — BUPIVACAINE HCL (PF) 0.5 % IJ SOLN
INTRAMUSCULAR | Status: DC | PRN
  Administered 2022-02-22: 30 mL

## 2022-02-22 MED ORDER — DEXAMETHASONE SODIUM PHOSPHATE 10 MG/ML IJ SOLN
10.0000 mg | Freq: Once | INTRAMUSCULAR | Status: AC
  Administered 2022-02-23: 10 mg via INTRAVENOUS
  Filled 2022-02-22: qty 1

## 2022-02-22 MED ORDER — METHOCARBAMOL 500 MG IVPB - SIMPLE MED
500.0000 mg | Freq: Four times a day (QID) | INTRAVENOUS | Status: DC | PRN
  Administered 2022-02-22: 500 mg via INTRAVENOUS
  Filled 2022-02-22: qty 50

## 2022-02-22 MED ORDER — CEFAZOLIN SODIUM-DEXTROSE 2-4 GM/100ML-% IV SOLN
2.0000 g | Freq: Four times a day (QID) | INTRAVENOUS | Status: AC
  Administered 2022-02-22 (×2): 2 g via INTRAVENOUS
  Filled 2022-02-22 (×2): qty 100

## 2022-02-22 MED ORDER — CHLORHEXIDINE GLUCONATE 0.12 % MT SOLN
15.0000 mL | Freq: Once | OROMUCOSAL | Status: AC
  Administered 2022-02-22: 15 mL via OROMUCOSAL

## 2022-02-22 MED ORDER — ONDANSETRON HCL 4 MG/2ML IJ SOLN
INTRAMUSCULAR | Status: DC | PRN
  Administered 2022-02-22: 4 mg via INTRAVENOUS

## 2022-02-22 MED ORDER — MORPHINE SULFATE (PF) 2 MG/ML IV SOLN
1.0000 mg | INTRAVENOUS | Status: DC | PRN
  Administered 2022-02-22: 1 mg via INTRAVENOUS
  Filled 2022-02-22: qty 1

## 2022-02-22 MED ORDER — METOCLOPRAMIDE HCL 5 MG/ML IJ SOLN: 5.0000 mg | Freq: Three times a day (TID) | INTRAMUSCULAR | Status: DC | PRN

## 2022-02-22 MED ORDER — METOCLOPRAMIDE HCL 5 MG PO TABS: 5.0000 mg | ORAL_TABLET | Freq: Three times a day (TID) | ORAL | Status: DC | PRN

## 2022-02-22 MED ORDER — MIDAZOLAM HCL 2 MG/2ML IJ SOLN
1.0000 mg | INTRAMUSCULAR | Status: DC
  Filled 2022-02-22: qty 2

## 2022-02-22 MED ORDER — CLOPIDOGREL BISULFATE 75 MG PO TABS
75.0000 mg | ORAL_TABLET | Freq: Every day | ORAL | Status: DC
  Administered 2022-02-23 – 2022-02-24 (×2): 75 mg via ORAL
  Filled 2022-02-22 (×2): qty 1

## 2022-02-22 MED ORDER — SODIUM CHLORIDE 0.9 % IV SOLN
INTRAVENOUS | Status: DC | PRN
  Administered 2022-02-22: 80 mL

## 2022-02-22 MED ORDER — PHENYLEPHRINE HCL (PRESSORS) 10 MG/ML IV SOLN
INTRAVENOUS | Status: DC | PRN
  Administered 2022-02-22 (×3): 80 ug via INTRAVENOUS

## 2022-02-22 MED ORDER — OXYCODONE HCL 5 MG PO TABS: 5.0000 mg | ORAL_TABLET | Freq: Once | ORAL | Status: AC | PRN

## 2022-02-22 MED ORDER — METHOCARBAMOL 500 MG PO TABS
500.0000 mg | ORAL_TABLET | Freq: Four times a day (QID) | ORAL | Status: DC | PRN
  Administered 2022-02-22 – 2022-02-24 (×4): 500 mg via ORAL
  Filled 2022-02-22 (×4): qty 1

## 2022-02-22 MED ORDER — PROPOFOL 500 MG/50ML IV EMUL
INTRAVENOUS | Status: DC | PRN
  Administered 2022-02-22: 75 ug/kg/min via INTRAVENOUS

## 2022-02-22 MED ORDER — ONDANSETRON HCL 4 MG/2ML IJ SOLN: 4.0000 mg | Freq: Four times a day (QID) | INTRAMUSCULAR | Status: DC | PRN

## 2022-02-22 MED ORDER — POLYETHYLENE GLYCOL 3350 17 G PO PACK: 17.0000 g | PACK | Freq: Every day | ORAL | Status: DC | PRN

## 2022-02-22 MED ORDER — ALLOPURINOL 300 MG PO TABS
300.0000 mg | ORAL_TABLET | Freq: Every day | ORAL | Status: DC
  Administered 2022-02-23 – 2022-02-24 (×2): 300 mg via ORAL
  Filled 2022-02-22 (×2): qty 1

## 2022-02-22 MED ORDER — SODIUM CHLORIDE 0.9 % IR SOLN
Status: DC | PRN
  Administered 2022-02-22: 1000 mL

## 2022-02-22 MED ORDER — METOPROLOL SUCCINATE ER 25 MG PO TB24
25.0000 mg | ORAL_TABLET | Freq: Every day | ORAL | Status: DC
Start: 2022-02-23 — End: 2022-02-24
  Administered 2022-02-23: 25 mg via ORAL
  Filled 2022-02-22 (×2): qty 1

## 2022-02-22 MED ORDER — MENTHOL 3 MG MT LOZG: 1.0000 | LOZENGE | OROMUCOSAL | Status: DC | PRN

## 2022-02-22 MED ORDER — OXYCODONE HCL 5 MG/5ML PO SOLN: 5.0000 mg | Freq: Once | ORAL | Status: AC | PRN

## 2022-02-22 MED ORDER — OXYCODONE HCL 5 MG PO TABS
ORAL_TABLET | ORAL | Status: AC
  Administered 2022-02-22: 5 mg via ORAL
  Filled 2022-02-22: qty 1

## 2022-02-22 MED ORDER — TRANEXAMIC ACID-NACL 1000-0.7 MG/100ML-% IV SOLN
1000.0000 mg | INTRAVENOUS | Status: AC
  Administered 2022-02-22: 1000 mg via INTRAVENOUS
  Filled 2022-02-22: qty 100

## 2022-02-22 MED ORDER — ATORVASTATIN CALCIUM 20 MG PO TABS
20.0000 mg | ORAL_TABLET | Freq: Every day | ORAL | Status: DC
  Administered 2022-02-23 – 2022-02-24 (×2): 20 mg via ORAL
  Filled 2022-02-22 (×2): qty 1

## 2022-02-22 MED ORDER — PHENOL 1.4 % MT LIQD: 1.0000 | OROMUCOSAL | Status: DC | PRN

## 2022-02-22 MED ORDER — BUPIVACAINE HCL (PF) 0.75 % IJ SOLN
INTRAMUSCULAR | Status: DC | PRN
  Administered 2022-02-22: 1.6 mL via INTRATHECAL

## 2022-02-22 MED ORDER — ACETAMINOPHEN 10 MG/ML IV SOLN
INTRAVENOUS | Status: AC
  Filled 2022-02-22: qty 100

## 2022-02-22 MED ORDER — PROPOFOL 10 MG/ML IV BOLUS
INTRAVENOUS | Status: AC
  Filled 2022-02-22: qty 20

## 2022-02-22 MED ORDER — BUPIVACAINE LIPOSOME 1.3 % IJ SUSP
INTRAMUSCULAR | Status: AC
  Filled 2022-02-22: qty 20

## 2022-02-22 MED ORDER — ONDANSETRON HCL 4 MG PO TABS: 4.0000 mg | ORAL_TABLET | Freq: Four times a day (QID) | ORAL | Status: DC | PRN

## 2022-02-22 MED ORDER — OXYCODONE HCL 5 MG PO TABS
5.0000 mg | ORAL_TABLET | ORAL | Status: DC | PRN
  Administered 2022-02-22 – 2022-02-24 (×7): 10 mg via ORAL
  Filled 2022-02-22 (×7): qty 2

## 2022-02-22 MED ORDER — FENTANYL CITRATE PF 50 MCG/ML IJ SOSY
50.0000 ug | PREFILLED_SYRINGE | INTRAMUSCULAR | Status: DC
  Administered 2022-02-22: 50 ug via INTRAVENOUS
  Filled 2022-02-22: qty 2

## 2022-02-22 MED ORDER — CEFAZOLIN SODIUM-DEXTROSE 2-4 GM/100ML-% IV SOLN
2.0000 g | INTRAVENOUS | Status: AC
  Administered 2022-02-22: 2 g via INTRAVENOUS
  Filled 2022-02-22: qty 100

## 2022-02-22 MED ORDER — STERILE WATER FOR IRRIGATION IR SOLN
Status: DC | PRN
Start: 2022-02-22 — End: 2022-02-22
  Administered 2022-02-22: 2000 mL

## 2022-02-22 MED ORDER — DIPHENHYDRAMINE HCL 12.5 MG/5ML PO ELIX: 12.5000 mg | ORAL_SOLUTION | ORAL | Status: DC | PRN

## 2022-02-22 MED ORDER — ACETAMINOPHEN 500 MG PO TABS
1000.0000 mg | ORAL_TABLET | Freq: Four times a day (QID) | ORAL | Status: AC
  Administered 2022-02-22 – 2022-02-23 (×4): 1000 mg via ORAL
  Filled 2022-02-22 (×5): qty 2

## 2022-02-22 MED ORDER — OXYCODONE HCL 5 MG PO TABS
ORAL_TABLET | ORAL | Status: AC
  Filled 2022-02-22: qty 1

## 2022-02-22 MED ORDER — POVIDONE-IODINE 10 % EX SWAB
2.0000 "application " | Freq: Once | CUTANEOUS | Status: AC
  Administered 2022-02-22: 2 via TOPICAL

## 2022-02-22 MED ORDER — SODIUM CHLORIDE (PF) 0.9 % IJ SOLN
INTRAMUSCULAR | Status: AC
  Filled 2022-02-22: qty 10

## 2022-02-22 MED ORDER — BUPIVACAINE LIPOSOME 1.3 % IJ SUSP: 20.0000 mL | Freq: Once | INTRAMUSCULAR | Status: DC

## 2022-02-22 MED ORDER — ALBUTEROL SULFATE (2.5 MG/3ML) 0.083% IN NEBU: 2.5000 mg | INHALATION_SOLUTION | RESPIRATORY_TRACT | Status: DC | PRN

## 2022-02-22 MED ORDER — ACETAMINOPHEN 10 MG/ML IV SOLN
INTRAVENOUS | Status: DC | PRN
  Administered 2022-02-22: 1000 mg via INTRAVENOUS

## 2022-02-22 MED ORDER — FUROSEMIDE 20 MG PO TABS
20.0000 mg | ORAL_TABLET | Freq: Every morning | ORAL | Status: DC
Start: 2022-02-23 — End: 2022-02-24
  Administered 2022-02-23: 20 mg via ORAL
  Filled 2022-02-22: qty 1

## 2022-02-22 SURGICAL SUPPLY — 55 items
ATTUNE MED DOME PAT 38 KNEE (Knees) ×1 IMPLANT
ATTUNE PS FEM RT SZ 6 CEM KNEE (Femur) ×1 IMPLANT
ATTUNE PSRP INSR SZ6 8 KNEE (Insert) ×1 IMPLANT
BAG COUNTER SPONGE SURGICOUNT (BAG) IMPLANT
BAG ZIPLOCK 12X15 (MISCELLANEOUS) ×2 IMPLANT
BASE TIBIA ATTUNE KNEE SYS SZ6 (Knees) IMPLANT
BLADE SAG 18X100X1.27 (BLADE) ×2 IMPLANT
BLADE SAW SGTL 11.0X1.19X90.0M (BLADE) ×2 IMPLANT
BNDG ELASTIC 6X5.8 VLCR STR LF (GAUZE/BANDAGES/DRESSINGS) ×2 IMPLANT
BOWL SMART MIX CTS (DISPOSABLE) ×2 IMPLANT
CEMENT HV SMART SET (Cement) ×4 IMPLANT
COVER SURGICAL LIGHT HANDLE (MISCELLANEOUS) ×2 IMPLANT
CUFF TOURN SGL QUICK 34 (TOURNIQUET CUFF) ×1
CUFF TRNQT CYL 34X4.125X (TOURNIQUET CUFF) ×1 IMPLANT
DRAPE INCISE IOBAN 66X45 STRL (DRAPES) ×2 IMPLANT
DRAPE U-SHAPE 47X51 STRL (DRAPES) ×2 IMPLANT
DRESSING AQUACEL AG SP 3.5X10 (GAUZE/BANDAGES/DRESSINGS) IMPLANT
DRSG AQUACEL AG ADV 3.5X10 (GAUZE/BANDAGES/DRESSINGS) ×2 IMPLANT
DRSG AQUACEL AG SP 3.5X10 (GAUZE/BANDAGES/DRESSINGS) ×2
DURAPREP 26ML APPLICATOR (WOUND CARE) ×2 IMPLANT
ELECT REM PT RETURN 15FT ADLT (MISCELLANEOUS) ×2 IMPLANT
GLOVE BIO SURGEON STRL SZ 6.5 (GLOVE) ×2 IMPLANT
GLOVE BIO SURGEON STRL SZ7 (GLOVE) ×2 IMPLANT
GLOVE BIO SURGEON STRL SZ8 (GLOVE) ×4 IMPLANT
GLOVE BIOGEL PI IND STRL 7.0 (GLOVE) ×1 IMPLANT
GLOVE BIOGEL PI IND STRL 8 (GLOVE) ×1 IMPLANT
GLOVE BIOGEL PI IND STRL 8.5 (GLOVE) ×1 IMPLANT
GLOVE BIOGEL PI INDICATOR 7.0 (GLOVE) ×1
GLOVE BIOGEL PI INDICATOR 8 (GLOVE) ×1
GLOVE BIOGEL PI INDICATOR 8.5 (GLOVE) ×1
GOWN STRL REUS W/ TWL LRG LVL3 (GOWN DISPOSABLE) ×1 IMPLANT
GOWN STRL REUS W/TWL LRG LVL3 (GOWN DISPOSABLE) ×1
HANDPIECE INTERPULSE COAX TIP (DISPOSABLE) ×1
HOLDER FOLEY CATH W/STRAP (MISCELLANEOUS) IMPLANT
IMMOBILIZER KNEE 20 (SOFTGOODS) ×2
IMMOBILIZER KNEE 20 THIGH 36 (SOFTGOODS) ×1 IMPLANT
KIT TURNOVER KIT A (KITS) IMPLANT
MANIFOLD NEPTUNE II (INSTRUMENTS) ×2 IMPLANT
NS IRRIG 1000ML POUR BTL (IV SOLUTION) ×2 IMPLANT
PACK TOTAL KNEE CUSTOM (KITS) ×2 IMPLANT
PADDING CAST COTTON 6X4 STRL (CAST SUPPLIES) ×3 IMPLANT
PROTECTOR NERVE ULNAR (MISCELLANEOUS) ×2 IMPLANT
SET HNDPC FAN SPRY TIP SCT (DISPOSABLE) ×1 IMPLANT
SPIKE FLUID TRANSFER (MISCELLANEOUS) ×2 IMPLANT
STRIP CLOSURE SKIN 1/2X4 (GAUZE/BANDAGES/DRESSINGS) ×4 IMPLANT
SUT MNCRL AB 4-0 PS2 18 (SUTURE) ×2 IMPLANT
SUT STRATAFIX 0 PDS 27 VIOLET (SUTURE) ×2
SUT VIC AB 2-0 CT1 27 (SUTURE) ×3
SUT VIC AB 2-0 CT1 TAPERPNT 27 (SUTURE) ×3 IMPLANT
SUTURE STRATFX 0 PDS 27 VIOLET (SUTURE) ×1 IMPLANT
TIBIA ATTUNE KNEE SYS BASE SZ6 (Knees) ×2 IMPLANT
TRAY FOLEY MTR SLVR 16FR STAT (SET/KITS/TRAYS/PACK) ×2 IMPLANT
TUBE SUCTION HIGH CAP CLEAR NV (SUCTIONS) ×2 IMPLANT
WATER STERILE IRR 1000ML POUR (IV SOLUTION) ×4 IMPLANT
WRAP KNEE MAXI GEL POST OP (GAUZE/BANDAGES/DRESSINGS) ×2 IMPLANT

## 2022-02-22 NOTE — Progress Notes (Signed)
AssistedDr. Elgie Congo with right, adductor canal block. Side rails up, monitors on throughout procedure. See vital signs in flow sheet. Tolerated Procedure well. ? ?

## 2022-02-22 NOTE — Plan of Care (Signed)
  Problem: Clinical Measurements: Goal: Ability to maintain clinical measurements within normal limits will improve Outcome: Progressing   Problem: Activity: Goal: Risk for activity intolerance will decrease Outcome: Progressing   Problem: Pain Managment: Goal: General experience of comfort will improve Outcome: Progressing   Problem: Safety: Goal: Ability to remain free from injury will improve Outcome: Progressing   

## 2022-02-22 NOTE — Interval H&P Note (Signed)
History and Physical Interval Note: ? ?02/22/2022 ?6:50 AM ? ?Grant Edwards  has presented today for surgery, with the diagnosis of right knee osteoarthritis.  The various methods of treatment have been discussed with the patient and family. After consideration of risks, benefits and other options for treatment, the patient has consented to  Procedure(s): ?TOTAL KNEE ARTHROPLASTY (Right) as a surgical intervention.  The patient's history has been reviewed, patient examined, no change in status, stable for surgery.  I have reviewed the patient's chart and labs.  Questions were answered to the patient's satisfaction.   ? ? ?Pilar Plate Natalynn Pedone ? ? ?

## 2022-02-22 NOTE — Anesthesia Preprocedure Evaluation (Addendum)
Anesthesia Evaluation  ?Patient identified by MRN, date of birth, ID band ?Patient awake ? ? ? ?Reviewed: ?Allergy & Precautions, NPO status , Patient's Chart, lab work & pertinent test results ? ?Airway ?Mallampati: III ? ?TM Distance: >3 FB ?Neck ROM: Full ? ? ?Comment: Redundant neck tissue ? Dental ?no notable dental hx. ? ?  ?Pulmonary ?asthma , sleep apnea and Continuous Positive Airway Pressure Ventilation ,  ?  ?Pulmonary exam normal ?breath sounds clear to auscultation ? ? ? ? ? ? Cardiovascular ?hypertension, + CAD and + Cardiac Stents (06/2021)  ?Normal cardiovascular exam ?Rhythm:Regular Rate:Normal ? ?AAA ?  ?Neuro/Psych ?Anxiety claustrophobianegative neurological ROS ?   ? GI/Hepatic ?negative GI ROS, Neg liver ROS,   ?Endo/Other  ?negative endocrine ROS ? Renal/GU ?Renal disease (kidney stones)  ?negative genitourinary ?  ?Musculoskeletal ? ?(+) Arthritis , Osteoarthritis,   ? Abdominal ?  ?Peds ?negative pediatric ROS ?(+)  Hematology ?negative hematology ROS ?(+)   ?Anesthesia Other Findings ? ? Reproductive/Obstetrics ?negative OB ROS ? ?  ? ? ? ? ? ? ? ? ? ? ? ? ? ?  ?  ? ? ? ? ? ?Anesthesia Physical ?Anesthesia Plan ? ?ASA: 3 ? ?Anesthesia Plan: Spinal and MAC  ? ?Post-op Pain Management: Tylenol PO (pre-op)*  ? ?Induction: Intravenous ? ?PONV Risk Score and Plan: 1 and Treatment may vary due to age or medical condition ? ?Airway Management Planned: Natural Airway and Simple Face Mask ? ?Additional Equipment: None ? ?Intra-op Plan:  ? ?Post-operative Plan: Extubation in OR ? ?Informed Consent: I have reviewed the patients History and Physical, chart, labs and discussed the procedure including the risks, benefits and alternatives for the proposed anesthesia with the patient or authorized representative who has indicated his/her understanding and acceptance.  ? ? ? ?Dental advisory given ? ?Plan Discussed with: CRNA, Anesthesiologist and Surgeon ? ?Anesthesia Plan  Comments: (Spinal. Adductor canal block. GA/LMA as backup. Norton Blizzard, MD  ?Last dose of plavix 02/15/22 ?)  ? ? ? ? ? ?Anesthesia Quick Evaluation ? ?

## 2022-02-22 NOTE — Progress Notes (Signed)
Orthopedic Tech Progress Note ?Patient Details:  ?Grant Edwards ?07/15/1941 ?[id number] ? ?CPM Right Knee ?CPM Right Knee: On ?Right Knee Flexion (Degrees): 40 ?Right Knee Extension (Degrees): 10 ? ?Post Interventions ?Patient Tolerated: Well ?Instructions Provided: Care of device, Adjustment of device ? ?Grant Edwards ?02/22/2022, 11:14 AM ? ?

## 2022-02-22 NOTE — Op Note (Signed)
OPERATIVE REPORT-TOTAL KNEE ARTHROPLASTY ? ? ?Pre-operative diagnosis- Osteoarthritis  Right knee(s) ? ?Post-operative diagnosis- Osteoarthritis Right knee(s) ? ?Procedure-  Right  Total Knee Arthroplasty ? ?Surgeon- Dione Plover. Jadalynn Burr, MD ? ?Assistant- Jaynie Bream, PA-C  ? ?Anesthesia-   Adductor canal block and spinal ? ?EBL-25 ml ?  ?Drains None ? ?Tourniquet time- 37 minutes @ 300 mm Hg ? ?Complications- None ? ?Condition-PACU - hemodynamically stable.  ? ?Brief Clinical Note  Grant Edwards is a 81 y.o. year old male with end stage OA of his right knee with progressively worsening pain and dysfunction. He has constant pain, with activity and at rest and significant functional deficits with difficulties even with ADLs. He has had extensive non-op management including analgesics, injections of cortisone and viscosupplements, and home exercise program, but remains in significant pain with significant dysfunction. Radiographs show bone on bone arthritis Medial and patellofemoral. He presents now for right Total Knee Arthroplasty.    ? ?Procedure in detail--- ? ? The patient is brought into the operating room and positioned supine on the operating table. After successful administration of  Adductor canal block and spinal,   a tourniquet is placed high on the  Right thigh(s) and the lower extremity is prepped and draped in the usual sterile fashion. Time out is performed by the operating team and then the  Right lower extremity is wrapped in Esmarch, knee flexed and the tourniquet inflated to 300 mmHg.  ?     A midline incision is made with a ten blade through the subcutaneous tissue to the level of the extensor mechanism. A fresh blade is used to make a medial parapatellar arthrotomy. Soft tissue over the proximal medial tibia is subperiosteally elevated to the joint line with a knife and into the semimembranosus bursa with a Cobb elevator. Soft tissue over the proximal lateral tibia is elevated with attention  being paid to avoiding the patellar tendon on the tibial tubercle. The patella is everted, knee flexed 90 degrees and the ACL and PCL are removed. Findings are bone on bone medial and patellofemoral with large global osteophytes   ?     The drill is used to create a starting hole in the distal femur and the canal is thoroughly irrigated with sterile saline to remove the fatty contents. The 5 degree Right  valgus alignment guide is placed into the femoral canal and the distal femoral cutting block is pinned to remove 9 mm off the distal femur. Resection is made with an oscillating saw. ?     The tibia is subluxed forward and the menisci are removed. The extramedullary alignment guide is placed referencing proximally at the medial aspect of the tibial tubercle and distally along the second metatarsal axis and tibial crest. The block is pinned to remove 24m off the more deficient medial  side. Resection is made with an oscillating saw. Size 6is the most appropriate size for the tibia and the proximal tibia is prepared with the modular drill and keel punch for that size. ?     The femoral sizing guide is placed and size 6 is most appropriate. Rotation is marked off the epicondylar axis and confirmed by creating a rectangular flexion gap at 90 degrees. The size 6 cutting block is pinned in this rotation and the anterior, posterior and chamfer cuts are made with the oscillating saw. The intercondylar block is then placed and that cut is made. ?     Trial size 6 tibial component, trial size 6  posterior stabilized femur and a 8  mm posterior stabilized rotating platform insert trial is placed. Full extension is achieved with excellent varus/valgus and anterior/posterior balance throughout full range of motion. The patella is everted and thickness measured to be 24  mm. Free hand resection is taken to 14 mm, a 38 template is placed, lug holes are drilled, trial patella is placed, and it tracks normally. Osteophytes are  removed off the posterior femur with the trial in place. All trials are removed and the cut bone surfaces prepared with pulsatile lavage. Cement is mixed and once ready for implantation, the size 6 tibial implant, size  6 posterior stabilized femoral component, and the size 38 patella are cemented in place and the patella is held with the clamp. The trial insert is placed and the knee held in full extension. The Exparel (20 ml mixed with 60 ml saline) is injected into the extensor mechanism, posterior capsule, medial and lateral gutters and subcutaneous tissues.  All extruded cement is removed and once the cement is hard the permanent 8 mm posterior stabilized rotating platform insert is placed into the tibial tray. ?     The wound is copiously irrigated with saline solution and the extensor mechanism closed with # 0 Stratofix suture. The tourniquet is released for a total tourniquet time of 37  minutes. Flexion against gravity is 140 degrees and the patella tracks normally. Subcutaneous tissue is closed with 2.0 vicryl and subcuticular with running 4.0 Monocryl. The incision is cleaned and dried and steri-strips and a bulky sterile dressing are applied. The limb is placed into a knee immobilizer and the patient is awakened and transported to recovery in stable condition. ?     Please note that a surgical assistant was a medical necessity for this procedure in order to perform it in a safe and expeditious manner. Surgical assistant was necessary to retract the ligaments and vital neurovascular structures to prevent injury to them and also necessary for proper positioning of the limb to allow for anatomic placement of the prosthesis. ? ? ?Dione Plover Kyra Laffey, MD ? ? ? ?02/22/2022, 10:24 AM ? ? ?

## 2022-02-22 NOTE — Anesthesia Procedure Notes (Signed)
Spinal ? ?Patient location during procedure: OR ?Start time: 02/22/2022 9:17 AM ?End time: 02/22/2022 9:23 AM ?Reason for block: surgical anesthesia ?Staffing ?Performed: anesthesiologist  ?Anesthesiologist: [name], MD ?Preanesthetic Checklist ?Completed: patient identified, IV checked, risks and benefits discussed, surgical consent, monitors and equipment checked, pre-op evaluation and timeout performed ?Spinal Block ?Patient position: sitting ?Prep: DuraPrep ?Patient monitoring: cardiac monitor, continuous pulse ox and blood pressure ?Approach: midline ?Location: L3-4 ?Injection technique: single-shot ?Needle ?Needle type: Pencan  ?Needle gauge: 24 G ?Needle length: 9 cm ?Assessment ?Events: CSF return ?Additional Notes ?Functioning IV was confirmed and monitors were applied. Sterile prep and drape, including hand hygiene and sterile gloves were used. The patient was positioned and the spine was prepped. The skin was anesthetized with lidocaine.  Free flow of clear CSF was obtained prior to injecting local anesthetic into the CSF.  The spinal needle aspirated freely following injection.  The needle was carefully withdrawn.  The patient tolerated the procedure well.  ? ? ? ?

## 2022-02-22 NOTE — Discharge Instructions (Signed)
? ?Gaynelle Arabian, MD ?Total Joint Specialist ?[facility] ?[address] ?[place], [place] [address] ?[phone] ? ?TOTAL KNEE REPLACEMENT POSTOPERATIVE DIRECTIONS ? ? ? ?Knee Rehabilitation, Guidelines Following Surgery  ?Results after knee surgery are often greatly improved when you follow the exercise, range of motion and muscle strengthening exercises prescribed by your doctor. Safety measures are also important to protect the knee from further injury. If any of these exercises cause you to have increased pain or swelling in your knee joint, decrease the amount until you are comfortable again and slowly increase them. If you have problems or questions, call your caregiver or physical therapist for advice.  ? ?BLOOD CLOT PREVENTION ?Continue Plavix as prescribed.   ? ?HOME CARE INSTRUCTIONS  ?Remove items at home which could result in a fall. This includes throw rugs or furniture in walking pathways.  ?ICE to the affected knee as much as tolerated. Icing helps control swelling. If the swelling is well controlled you will be more comfortable and rehab easier. Continue to use ice on the knee for pain and swelling from surgery. You may notice swelling that will progress down to the foot and ankle. This is normal after surgery. Elevate the leg when you are not up walking on it.    ?Continue to use the breathing machine which will help keep your temperature down. It is common for your temperature to cycle up and down following surgery, especially at night when you are not up moving around and exerting yourself. The breathing machine keeps your lungs expanded and your temperature down. ?Do not place pillow under the operative knee, focus on keeping the knee straight while resting ? ?DIET ?You may resume your previous home diet once you are discharged from the hospital. ? ?DRESSING / WOUND CARE / SHOWERING ?Keep your bulky bandage on for 2 days. On the third post-operative day you may  remove the Ace bandage and gauze. There is a waterproof adhesive bandage on your skin which will stay in place until your first follow-up appointment. Once you remove this you will not need to place another bandage ?You may begin showering 3 days following surgery, but do not submerge the incision under water. ? ?ACTIVITY ?For the first 5 days, the key is rest and control of pain and swelling ?Do your home exercises twice a day starting on post-operative day 3. On the days you go to physical therapy, just do the home exercises once that day. ?You should rest, ice and elevate the leg for 50 minutes out of every hour. Get up and walk/stretch for 10 minutes per hour. After 5 days you can increase your activity slowly as tolerated. ?Walk with your walker as instructed. Use the walker until you are comfortable transitioning to a cane. Walk with the cane in the opposite hand of the operative leg. You may discontinue the cane once you are comfortable and walking steadily. ?Avoid periods of inactivity such as sitting longer than an hour when not asleep. This helps prevent blood clots.  ?You may discontinue the knee immobilizer once you are able to perform a straight leg raise while lying down. ?You may resume a sexual relationship in one month or when given the OK by your doctor.  ?You may return to work once you are cleared by your doctor.  ?Do not drive a car for 6 weeks or until released by your surgeon.  ?Do not drive while taking narcotics. ? ?TED HOSE STOCKINGS ?Wear the elastic stockings  on both legs for three weeks following surgery during the day. You may remove them at night for sleeping. ? ?WEIGHT BEARING ?Weight bearing as tolerated with assist device (walker, cane, etc) as directed, use it as long as suggested by your surgeon or therapist, typically at least 4-6 weeks. ? ?POSTOPERATIVE CONSTIPATION PROTOCOL ?Constipation - defined medically as fewer than three stools per week and severe constipation as less  than one stool per week. ? ?One of the most common issues patients have following surgery is constipation.  Even if you have a regular bowel pattern at home, your normal regimen is likely to be disrupted due to multiple reasons following surgery.  Combination of anesthesia, postoperative narcotics, change in appetite and fluid intake all can affect your bowels.  In order to avoid complications following surgery, here are some recommendations in order to help you during your recovery period. ? ?Colace (docusate) - Pick up an over-the-counter form of Colace or another stool softener and take twice a day as long as you are requiring postoperative pain medications.  Take with a full glass of water daily.  If you experience loose stools or diarrhea, hold the colace until you stool forms back up. If your symptoms do not get better within 1 week or if they get worse, check with your doctor. ?Dulcolax (bisacodyl) - Pick up over-the-counter and take as directed by the product packaging as needed to assist with the movement of your bowels.  Take with a full glass of water.  Use this product as needed if not relieved by Colace only.  ?MiraLax (polyethylene glycol) - Pick up over-the-counter to have on hand. MiraLax is a solution that will increase the amount of water in your bowels to assist with bowel movements.  Take as directed and can mix with a glass of water, juice, soda, coffee, or tea. Take if you go more than two days without a movement. Do not use MiraLax more than once per day. Call your doctor if you are still constipated or irregular after using this medication for 7 days in a row. ? ?If you continue to have problems with postoperative constipation, please contact the office for further assistance and recommendations.  If you experience "the worst abdominal pain ever" or develop nausea or vomiting, please contact the office immediatly for further recommendations for treatment. ? ?ITCHING ?If you experience itching  with your medications, try taking only a single pain pill, or even half a pain pill at a time.  You can also use Benadryl over the counter for itching or also to help with sleep.  ? ?MEDICATIONS ?See your medication summary on the ?After Visit Summary? that the nursing staff will review with you prior to discharge.  You may have some home medications which will be placed on hold until you complete the course of blood thinner medication.  It is important for you to complete the blood thinner medication as prescribed by your surgeon.  Continue your approved medications as instructed at time of discharge. ? ?PRECAUTIONS ?If you experience chest pain or shortness of breath - call [phone] immediately for transfer to the hospital emergency department.  ?If you develop a fever greater that 101 F, purulent drainage from wound, increased redness or drainage from wound, foul odor from the wound/dressing, or calf pain - CONTACT YOUR SURGEON.   ?                                                ?  FOLLOW-UP APPOINTMENTS ?Make sure you keep all of your appointments after your operation with your surgeon and caregivers. You should call the office at the above phone number and make an appointment for approximately two weeks after the date of your surgery or on the date instructed by your surgeon outlined in the "After Visit Summary". ? ?RANGE OF MOTION AND STRENGTHENING EXERCISES  ?Rehabilitation of the knee is important following a knee injury or an operation. After just a few days of immobilization, the muscles of the thigh which control the knee become weakened and shrink (atrophy). Knee exercises are designed to build up the tone and strength of the thigh muscles and to improve knee motion. Often times heat used for twenty to thirty minutes before working out will loosen up your tissues and help with improving the range of motion but do not use heat for the first two weeks following surgery. These exercises can be done on a training  (exercise) mat, on the floor, on a table or on a bed. Use what ever works the best and is most comfortable for you Knee exercises include:  ?Leg Lifts - While your knee is still immobilized in a splint or ca

## 2022-02-22 NOTE — Anesthesia Procedure Notes (Signed)
Anesthesia Regional Block: Adductor canal block  ? ?Pre-Anesthetic Checklist: , timeout performed,  Correct Patient, Correct Site, Correct Laterality,  Correct Procedure, Correct Position, site marked,  Risks and benefits discussed,  Surgical consent,  Pre-op evaluation,  At surgeon's request and post-op pain management ? ?Laterality: Right ? ?Prep: chloraprep     ?  ?Needles:  ?Injection technique: Single-shot ? ?Needle Type: Echogenic Stimulator Needle   ? ? ?Needle Length: 10cm  ?Needle Gauge: 20  ? ? ? ?Additional Needles: ? ? ?Procedures:,,,, ultrasound used (permanent image in chart),,    ?Narrative:  ?Start time: 02/22/2022 8:35 AM ?End time: 02/22/2022 8:40 AM ?Injection made incrementally with aspirations every 5 mL. ? ?Performed by: Personally  ?Anesthesiologist: [name], MD ? ?Additional Notes: ?Functioning IV was confirmed and monitors were applied.  Sterile prep and drape,hand hygiene and sterile gloves were used. Ultrasound guidance: relevant anatomy identified, needle position confirmed, local anesthetic spread visualized around nerve(s)., vascular puncture avoided. Negative aspiration and negative test dose prior to incremental administration of local anesthetic. The patient tolerated the procedure well. ? ? ? ? ? ? ?

## 2022-02-22 NOTE — Anesthesia Postprocedure Evaluation (Signed)
Anesthesia Post Note ? ?Patient: Grant Edwards ? ?Procedure(s) Performed: TOTAL KNEE ARTHROPLASTY (Right: Knee) ? ?  ? ?Patient location during evaluation: PACU ?Anesthesia Type: MAC ?Level of consciousness: awake and alert ?Pain management: pain level controlled ?Vital Signs Assessment: post-procedure vital signs reviewed and stable ?Respiratory status: spontaneous breathing, nonlabored ventilation and respiratory function stable ?Cardiovascular status: stable and blood pressure returned to baseline ?Postop Assessment: no apparent nausea or vomiting ?Anesthetic complications: no ? ? ?No notable events documented. ? ?Last Vitals:  ?Vitals:  ? 02/22/22 1245 02/22/22 1300  ?BP: 134/81 (!) 144/81  ?Pulse: (!) 43 (!) 43  ?Resp: 15 19  ?Temp:    ?SpO2: 99% 100%  ?  ?Last Pain:  ?Vitals:  ? 02/22/22 1300  ?TempSrc:   ?PainSc: Asleep  ? ? ?LLE Motor Response: Purposeful movement (02/22/22 1300) ?LLE Sensation: Decreased (02/22/22 1300) ?RLE Motor Response: Purposeful movement (02/22/22 1300) ?RLE Sensation: Decreased (02/22/22 1300) ?L Sensory Level: L5-Outer lower leg, top of foot, great toe (02/22/22 1300) ?R Sensory Level: L5-Outer lower leg, top of foot, great toe (02/22/22 1300) ? ?[name] ? ? ? ? ?

## 2022-02-22 NOTE — Evaluation (Signed)
Physical Therapy Evaluation ?Patient Details ?Name: Grant Edwards ?MRN: [id number] ?DOB: 05-02-1941 ?Today's Date: 02/22/2022 ? ?History of Present Illness ? 81 yo male s/p R TKA on 02/22/22. PMH: HTN, gout, AAA  ?Clinical Impression ? Pt is s/p TKA resulting in the deficits listed below (see PT Problem List).  ?PT amb ~ 8' with RW and min assist. Distance limited by pain ? Pt will benefit from skilled PT to increase their independence and safety with mobility to allow discharge to the venue listed below.  ?   ?   ? ?Recommendations for follow up therapy are one component of a multi-disciplinary discharge planning process, led by the attending physician.  Recommendations may be updated based on patient status, additional functional criteria and insurance authorization. ? ?Follow Up Recommendations Follow physician's recommendations for discharge plan and follow up therapies ? ?  ?Assistance Recommended at Discharge Intermittent Supervision/Assistance  ?Patient can return home with the following ? A little help with walking and/or transfers;A little help with bathing/dressing/bathroom;Assistance with cooking/housework;Assist for transportation;Help with stairs or ramp for entrance ? ?  ?Equipment Recommendations Rolling walker (2 wheels)  ?Recommendations for Other Services ?    ?  ?Functional Status Assessment Patient has had a recent decline in their functional status and demonstrates the ability to make significant improvements in function in a reasonable and predictable amount of time.  ? ?  ?Precautions / Restrictions Precautions ?Precautions: Knee;Fall ?Required Braces or Orthoses: Knee Immobilizer - Right ?Knee Immobilizer - Right: Discontinue once straight leg raise with < 10 degree lag ?Restrictions ?Weight Bearing Restrictions: No ?Other Position/Activity Restrictions: WBAT  ? ?  ? ?Mobility ? Bed Mobility ?Overal bed mobility: Needs Assistance ?Bed Mobility: Supine to Sit ?  ?  ?Supine to sit: Min assist,  HOB elevated ?  ?  ?General bed mobility comments: assist with RLE ?  ? ?Transfers ?Overall transfer level: Needs assistance ?Equipment used: Rolling walker (2 wheels) ?Transfers: Sit to/from Stand ?Sit to Stand: Min assist, From elevated surface ?  ?  ?  ?  ?  ?General transfer comment: assist to rise and transition from RW. cues for hand placement and assist to advance RLE prior to descent ?  ? ?Ambulation/Gait ?Ambulation/Gait assistance: Min guard ?Gait Distance (Feet): 6 Feet ?Assistive device: Rolling walker (2 wheels) ?Gait Pattern/deviations: Step-to pattern, Decreased stance time - right ?  ?  ?  ?General Gait Details: cues for sequence and RW position. assist to balance with wt shifting ? ?Stairs ?  ?  ?  ?  ?  ? ?Wheelchair Mobility ?  ? ?Modified Rankin (Stroke Patients Only) ?  ? ?  ? ?Balance   ?  ?  ?  ?  ?  ?  ?  ?  ?  ?  ?  ?  ?  ?  ?  ?  ?  ?  ?   ? ? ? ?Pertinent Vitals/Pain Pain Assessment ?Pain Assessment: 0-10 ?Pain Score: 6  ?Pain Location: right knee ?Pain Descriptors / Indicators: Aching, Grimacing, Sore ?Pain Intervention(s): Premedicated before session, Monitored during session, Limited activity within patient's tolerance, Repositioned  ? ? ?Home Living Family/patient expects to be discharged to:: Private residence ?Living Arrangements: Spouse/significant other ?Available Help at Discharge: Family;Available 24 hours/day ?Type of Home: House ?Home Access: Ramped entrance ?  ?  ?  ?Home Layout: One level ?Home Equipment: Kasandra Knudsen - single point ?   ?  ?Prior Function Prior Level of Function : Independent/Modified Independent ?  ?  ?  ?  ?  ?  ?  Mobility Comments: amb with cane ?  ?  ? ? ?Hand Dominance  ?   ? ?  ?Extremity/Trunk Assessment  ? Upper Extremity Assessment ?Upper Extremity Assessment: Overall WFL for tasks assessed ?  ? ?Lower Extremity Assessment ?Lower Extremity Assessment: RLE deficits/detail ?RLE Deficits / Details: ankle WFL, knee extension and hip flexion 2+/5 ?RLE: Unable to  fully assess due to pain ?  ? ?   ?Communication  ? Communication: No difficulties  ?Cognition Arousal/Alertness: Awake/alert ?Behavior During Therapy: [facility] for tasks assessed/performed ?Overall Cognitive Status: No family/caregiver present to determine baseline cognitive functioning ?Area of Impairment: Following commands, Memory ?  ?  ?  ?  ?  ?  ?  ?  ?  ?  ?Memory: Decreased short-term memory ?Following Commands: Follows one step commands with increased time, Follows multi-step commands inconsistently ?  ?  ?  ?  ?  ?  ? ?  ?General Comments   ? ?  ?Exercises Total Joint Exercises ?Ankle Circles/Pumps: AROM, Both, 10 reps ?Quad Sets: AROM, Right, 5 reps  ? ?Assessment/Plan  ?  ?PT Assessment Patient needs continued PT services  ?PT Problem List Decreased strength;Decreased mobility;Decreased range of motion;Decreased activity tolerance;Decreased balance;Decreased knowledge of use of DME;Pain;Decreased cognition ? ?   ?  ?PT Treatment Interventions DME instruction;Therapeutic exercise;Gait training;Functional mobility training;Therapeutic activities;Patient/family education   ? ?PT Goals (Current goals can be found in the Care Plan section)  ?Acute Rehab PT Goals ?Patient Stated Goal: home ?PT Goal Formulation: With patient ?Time For Goal Achievement: 03/01/22 ?Potential to Achieve Goals: Good ? ?  ?Frequency 7X/week ?  ? ? ?Co-evaluation   ?  ?  ?  ?  ? ? ?  ?AM-PAC PT "6 Clicks" Mobility  ?Outcome Measure Help needed turning from your back to your side while in a flat bed without using bedrails?: A Little ?Help needed moving from lying on your back to sitting on the side of a flat bed without using bedrails?: A Little ?Help needed moving to and from a bed to a chair (including a wheelchair)?: A Little ?Help needed standing up from a chair using your arms (e.g., wheelchair or bedside chair)?: A Little ?Help needed to walk in hospital room?: A Little ?Help needed climbing 3-5 steps with a railing? : A Lot ?6 Click  Score: 17 ? ?  ?End of Session Equipment Utilized During Treatment: Gait belt;Right knee immobilizer ?Activity Tolerance: Patient tolerated treatment well ?Patient left: with call bell/phone within reach;in chair;with chair alarm set ?  ?PT Visit Diagnosis: Other abnormalities of gait and mobility (R26.89);Difficulty in walking, not elsewhere classified (R26.2) ?  ? ?Time: 2878-6767 ?PT Time Calculation (min) (ACUTE ONLY): 24 min ? ? ?Charges:   PT Evaluation ?$PT Eval Low Complexity: 1 Low ?  ?  ?   ? ? ?Baxter Flattery, PT ? ?Acute Rehab Dept [facility]) [phone] ?Pager [phone] ? ?02/22/2022 ? ? ?Isabell Bonafede ?02/22/2022, 4:04 PM ? ?

## 2022-02-22 NOTE — Transfer of Care (Signed)
Immediate Anesthesia Transfer of Care Note ? ?Patient: Grant Edwards ? ?Procedure(s) Performed: TOTAL KNEE ARTHROPLASTY (Right: Knee) ? ?Patient Location: PACU ? ?Anesthesia Type:Spinal ? ?Level of Consciousness: sedated, patient cooperative and responds to stimulation ? ?Airway & Oxygen Therapy: Patient Spontanous Breathing and Patient connected to face mask oxygen ? ?Post-op Assessment: Report given to RN and Post -op Vital signs reviewed and stable ? ?Post vital signs: Reviewed and stable ? ?Last Vitals:  ?Vitals Value Taken Time  ?BP 109/66 02/22/22 1102  ?Temp    ?Pulse 48 02/22/22 1103  ?Resp 23 02/22/22 1103  ?SpO2 99 % 02/22/22 1103  ?Vitals shown include unvalidated device data. ? ?Last Pain:  ?Vitals:  ? 02/22/22 0719  ?TempSrc:   ?PainSc: 0-No pain  ?   ? ?Patients Stated Pain Goal: 4 (02/22/22 0719) ? ?Complications: No notable events documented. ?

## 2022-02-23 ENCOUNTER — Encounter (HOSPITAL_COMMUNITY): Payer: Self-pay | Admitting: Orthopedic Surgery

## 2022-02-23 DIAGNOSIS — M1711 Unilateral primary osteoarthritis, right knee: Secondary | ICD-10-CM | POA: Diagnosis not present

## 2022-02-23 LAB — BASIC METABOLIC PANEL
Anion gap: 7 (ref 5–15)
BUN: 21 mg/dL (ref 8–23)
CO2: 24 mmol/L (ref 22–32)
Calcium: 8.8 mg/dL — ABNORMAL LOW (ref 8.9–10.3)
Chloride: 105 mmol/L (ref 98–111)
Creatinine, Ser: 1.05 mg/dL (ref 0.61–1.24)
GFR, Estimated: >60 mL/min (ref >60)
Glucose, Bld: 123 mg/dL — ABNORMAL HIGH (ref 70–99)
Potassium: 4.2 mmol/L (ref 3.5–5.1)
Sodium: 136 mmol/L (ref 135–145)

## 2022-02-23 LAB — CBC
HCT: 38 % — ABNORMAL LOW (ref 39.0–52.0)
Hemoglobin: 12.3 g/dL — ABNORMAL LOW (ref 13.0–17.0)
MCH: 29.4 pg (ref 26.0–34.0)
MCHC: 32.4 g/dL (ref 30.0–36.0)
MCV: 90.7 fL (ref 80.0–100.0)
Platelets: 173 10*3/uL (ref 150–400)
RBC: 4.19 MIL/uL — ABNORMAL LOW (ref 4.22–5.81)
RDW: 14.8 % (ref 11.5–15.5)
WBC: 9.6 10*3/uL (ref 4.0–10.5)
nRBC: 0 % (ref 0.0–0.2)

## 2022-02-23 MED ORDER — TRAMADOL HCL 50 MG PO TABS: 50.0000 mg | ORAL_TABLET | Freq: Four times a day (QID) | ORAL | 0 refills | Status: DC | PRN

## 2022-02-23 MED ORDER — OXYCODONE HCL 5 MG PO TABS: 5.0000 mg | ORAL_TABLET | Freq: Four times a day (QID) | ORAL | 0 refills | Status: DC | PRN

## 2022-02-23 MED ORDER — METHOCARBAMOL 500 MG PO TABS: 500.0000 mg | ORAL_TABLET | Freq: Four times a day (QID) | ORAL | 0 refills | Status: DC | PRN

## 2022-02-23 NOTE — Progress Notes (Signed)
Physical Therapy Treatment ?Patient Details ?Name: Grant Edwards ?MRN: [id number] ?DOB: Apr 03, 1941 ?Today's Date: 02/23/2022 ? ? 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 PMH: HTN, gout, AAA ? ?  ?PT Comments  ? ? Slow progression toward goals. Amb ~ 20' and reviewed TKA exercises however remains limited by pain. Pt may need another day to work with PT and on pain control.  Will see again in pm ?  ?Recommendations for follow up therapy are one component of a multi-disciplinary discharge planning process, led by the attending physician.  Recommendations may be updated based on patient status, additional functional criteria and insurance authorization. ? ?Follow Up Recommendations ? Follow physician's recommendations for discharge plan and follow up therapies ?  ?  ?Assistance Recommended at Discharge Intermittent Supervision/Assistance  ?Patient can return home with the following A little help with walking and/or transfers;A little help with bathing/dressing/bathroom;Assistance with cooking/housework;Assist for transportation;Help with stairs or ramp for entrance ?  ?Equipment Recommendations ? Rolling walker (2 wheels)  ?  ?Recommendations for Other Services   ? ? ?  ?Precautions / Restrictions Precautions ?Precautions: Knee;Fall ?Required Braces or Orthoses: Knee Immobilizer - Right ?Knee Immobilizer - Right: Discontinue once straight leg raise with < 10 degree lag ?Restrictions ?Weight Bearing Restrictions: No ?RLE Weight Bearing: Weight bearing as tolerated  ?  ? ?Mobility ? Bed Mobility ?Overal bed mobility: Needs Assistance ?Bed Mobility: Supine to Sit ?  ?  ?Supine to sit: Min assist, HOB elevated ?  ?  ?General bed mobility comments: assist with RLE ?  ? ?Transfers ?Overall transfer level: Needs assistance ?Equipment used: Rolling walker (2 wheels) ?Transfers: Sit to/from Stand ?Sit to Stand: Min assist, From elevated surface ?  ?  ?  ?  ?  ?General transfer comment: assist to rise and  transition from RW. cues for hand placement and assist to advance RLE prior to descent ?  ? ?Ambulation/Gait ?Ambulation/Gait assistance: Min guard, Min assist ?Gait Distance (Feet): 20 Feet ?Assistive device: Rolling walker (2 wheels) ?Gait Pattern/deviations: Step-to pattern, Decreased stance time - right ?Gait velocity: decr ?  ?  ?General Gait Details: cues for sequence, step to gait and RW position. assist to balance with wt shifting ? ? ?Stairs ?  ?  ?  ?  ?  ? ? ?Wheelchair Mobility ?  ? ?Modified Rankin (Stroke Patients Only) ?  ? ? ?  ?Balance   ?  ?  ?  ?  ?  ?  ?  ?  ?  ?  ?  ?  ?  ?  ?  ?  ?  ?  ?  ? ?  ?Cognition Arousal/Alertness: Awake/alert ?Behavior During Therapy: [facility] for tasks assessed/performed ?Overall Cognitive Status: Within Functional Limits for tasks assessed ?  ?  ?  ?  ?  ?  ?  ?  ?  ?  ?  ?  ?  ?  ?  ?  ?  ?  ?  ? ?  ?Exercises Total Joint Exercises ?Ankle Circles/Pumps: AROM, Both, 10 reps ?Quad Sets: AROM, Both, 10 reps ?Heel Slides: AAROM, Right, 10 reps ? ?  ?General Comments   ?  ?  ? ?Pertinent Vitals/Pain Pain Assessment ?Pain Assessment: 0-10 ?Pain Score: 6  ?Pain Location: right knee ?Pain Descriptors / Indicators: Aching, Grimacing, Sore ?Pain Intervention(s): Limited activity within patient's tolerance, Monitored during session, Premedicated before session, Repositioned, Ice applied  ? ? ?Home Living   ?  ?  ?  ?  ?  ?  ?  ?  ?  ?   ?  ?  Prior Function    ?  ?  ?   ? ?PT Goals (current goals can now be found in the care plan section) Acute Rehab PT Goals ?Patient Stated Goal: home ?PT Goal Formulation: With patient ?Time For Goal Achievement: 03/01/22 ?Potential to Achieve Goals: Good ?Progress towards PT goals: Progressing toward goals ? ?  ?Frequency ? ? ? 7X/week ? ? ? ?  ?PT Plan Current plan remains appropriate  ? ? ?Co-evaluation   ?  ?  ?  ?  ? ?  ?AM-PAC PT "6 Clicks" Mobility   ?Outcome Measure ? Help needed turning from your back to your side while in a flat bed  without using bedrails?: A Little ?Help needed moving from lying on your back to sitting on the side of a flat bed without using bedrails?: A Little ?Help needed moving to and from a bed to a chair (including a wheelchair)?: A Little ?Help needed standing up from a chair using your arms (e.g., wheelchair or bedside chair)?: A Little ?Help needed to walk in hospital room?: A Little ?Help needed climbing 3-5 steps with a railing? : A Lot ?6 Click Score: 17 ? ?  ?End of Session Equipment Utilized During Treatment: Gait belt;Right knee immobilizer ?Activity Tolerance: Patient tolerated treatment well ?Patient left: with call bell/phone within reach;in chair;with chair alarm set;with family/visitor present ?Nurse Communication: Mobility status ?PT Visit Diagnosis: Other abnormalities of gait and mobility (R26.89);Difficulty in walking, not elsewhere classified (R26.2) ?  ? ? ?Time: 9628-3662 ?PT Time Calculation (min) (ACUTE ONLY): 29 min ? ?Charges:  $Gait Training: 8-22 mins ?$Therapeutic Exercise: 8-22 mins          ?          ? Baxter Flattery, PT ? ?Acute Rehab Dept [facility]) [phone] ?Pager [phone] ? ?02/23/2022 ? ? ? ?Lakota Schweppe ?02/23/2022, 11:47 AM ? ?

## 2022-02-23 NOTE — Progress Notes (Signed)
? ?  Subjective: ?1 Day Post-Op Procedure(s) (LRB): ?TOTAL KNEE ARTHROPLASTY (Right) ?Patient reports pain as mild.   ?Patient seen in rounds by Dr. Wynelle Link. ?Patient is well, and has had no acute complaints or problems. Had slower progression with PT yesterday, will see if meeting goals today. Denies chest pain or SOB. Foley catheter removed this AM. No issues overnight.  ? ?Objective: ?Vital signs in last 24 hours: ?Temp:  [97.4 ?F (36.3 ?C)-98.4 ?F (36.9 ?C)] 97.5 ?F (36.4 ?C) (04/18 0640) ?Pulse Rate:  [38-56] 48 (04/18 0640) ?Resp:  [11-27] 16 (04/18 0640) ?BP: (104-150)/(66-89) 116/77 (04/18 0640) ?SpO2:  [91 %-100 %] 95 % (04/18 0640) ? ?Intake/Output from previous day: ? ?Intake/Output Summary (Last 24 hours) at 02/23/2022 0809 ?Last data filed at 02/23/2022 0700 ?Gross per 24 hour  ?Intake 3297.28 ml  ?Output 1775 ml  ?Net 1522.28 ml  ?  ? ?Intake/Output this shift: ?No intake/output data recorded. ? ?Labs: ?No results for input(s): HGB in the last 72 hours. ?No results for input(s): WBC, RBC, HCT, PLT in the last 72 hours. ?No results for input(s): NA, K, CL, CO2, BUN, CREATININE, GLUCOSE, CALCIUM in the last 72 hours. ?No results for input(s): LABPT, INR in the last 72 hours. ? ?Exam: ?General - Patient is Alert and Oriented ?Extremity - Neurologically intact ?Neurovascular intact ?Sensation intact distally ?Dorsiflexion/Plantar flexion intact ?Dressing - dressing C/D/I ?Motor Function - intact, moving foot and toes well on exam.  ? ?Past Medical History:  ?Diagnosis Date  ? AAA (abdominal aortic aneurysm) ([place])   ? abdominal and Acending Aortic   ? Arthritis   ? Asthma   ? PRN inhaler  ? Claustrophobia   ? Coronary artery disease   ? Eczema   ? Gout   ? History of kidney stones   ? Hx of adenomatous colonic polyps   ? Hyperlipidemia   ? Hypertension   ? Macular degeneration   ? Pneumonia   ? 81 years old  ? Sleep apnea   ? CPAP  13 setting  ? ? ?Assessment/Plan: ?1 Day Post-Op Procedure(s) (LRB): ?TOTAL  KNEE ARTHROPLASTY (Right) ?Principal Problem: ?  OA (osteoarthritis) of knee ?Active Problems: ?  Osteoarthritis of right knee ? ?Estimated body mass index is 32.59 kg/m? as calculated from the following: ?  Height as of this encounter: 5' 11.5" (1.816 m). ?  Weight as of this encounter: 107.5 kg. ?Advance diet ?Up with therapy ?D/C IV fluids ? ? ?Patient's anticipated LOS is less than 2 midnights, meeting these requirements: ?- Lives within 1 hour of care ?- Has a competent adult at home to recover with post-op recover ?- NO history of ? - Chronic pain requiring opioids ? - Diabetes ? - Heart failure ? - Heart attack ? - Stroke ? - DVT/VTE ? - Cardiac arrhythmia ? - Respiratory Failure/COPD ? - Renal failure ? - Anemia ? - Advanced Liver disease ? ?DVT Prophylaxis -  Plavix ?Weight bearing as tolerated. ?Continue therapy. ? ?Awaiting CBC/BMP results. ? ?Plan is to go Home after hospital stay. ?Possible discharge this afternoon if progresses with therapy enough to safely dc home. ?Scheduled for OPPT at [facility]. ?Follow-up in the office in 2 weeks. ? ?The PDMP database was reviewed today prior to any opioid medications being prescribed to this patient. ? ?Theresa Duty, PA-C ?Orthopedic Surgery ?[phone] ?02/23/2022, 8:09 AM ? ?

## 2022-02-23 NOTE — TOC Transition Note (Addendum)
Transition of Care (TOC) - CM/SW Discharge Note ? ? ?Patient Details  ?Name: Grant Edwards ?MRN: [id number] ?Date of Birth: May 16, 1941 ? ?Transition of Care (TOC) CM/SW Contact:  ?Christhoper Busbee, LCSW ?Phone Number: ?02/23/2022, 10:51 AM ? ? ?Clinical Narrative:    ? ?Met with pt and confirming he has received his rw from [place].  Plan for OPPT at [facility].  No TOC needs. ? ?02/24/22 Addendum: ?Alerted by RN that pt now requesting 3n1 commode.  Discussed with pt and he is aware this is private pay item and he asks that it be ordered.  No agency preferences - referral placed with [facility] for delivery to patient's room today. ? ?Final next level of care: OP Rehab ?Barriers to Discharge: No Barriers Identified ? ? ?Patient Goals and CMS Choice ?Patient states their goals for this hospitalization and ongoing recovery are:: return home ?  ?  ? ?Discharge Placement ?  ?           ?  ?  ?  ?  ? ?Discharge Plan and Services ?  ?  ?           ?DME Arranged: Walker rolling ?DME Agency: [employer] ?  ?  ?  ?  ?  ?  ?  ?  ? ?Social Determinants of Health (SDOH) Interventions ?  ? ? ?Readmission Risk Interventions ?   ? View : No data to display.  ?  ?  ?  ? ? ? ? ? ?

## 2022-02-23 NOTE — Progress Notes (Signed)
Physical Therapy Treatment ?Patient Details ?Name: Grant Edwards ?MRN: [id number] ?DOB: 1941/04/12 ?Today's Date: 02/23/2022 ? ? 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 PMH: HTN, gout, AAA ? ?  ?PT Comments  ? ? Pt progressing with PT  although fatigues easily and still having some issues with pain control. Feel pt will benefit from another day to maximize independence and safety.  ?Recommendations for follow up therapy are one component of a multi-disciplinary discharge planning process, led by the attending physician.  Recommendations may be updated based on patient status, additional functional criteria and insurance authorization. ? ?Follow Up Recommendations ? Follow physician's recommendations for discharge plan and follow up therapies ?  ?  ?Assistance Recommended at Discharge Intermittent Supervision/Assistance  ?Patient can return home with the following A little help with walking and/or transfers;A little help with bathing/dressing/bathroom;Assistance with cooking/housework;Assist for transportation;Help with stairs or ramp for entrance ?  ?Equipment Recommendations ? Rolling walker (2 wheels)  ?  ?Recommendations for Other Services   ? ? ?  ?Precautions / Restrictions Precautions ?Precautions: Knee;Fall ?Required Braces or Orthoses: Knee Immobilizer - Right ?Knee Immobilizer - Right: Discontinue once straight leg raise with < 10 degree lag ?Restrictions ?Weight Bearing Restrictions: No ?RLE Weight Bearing: Weight bearing as tolerated  ?  ? ?Mobility ? Bed Mobility ?Overal bed mobility: Needs Assistance ?Bed Mobility: Sit to Supine ?  ?  ?Supine to sit: Min assist, HOB elevated ?Sit to supine: Min guard ?  ?General bed mobility comments: pt able to lift LEs on to bed with incr time, supervision for safety ?  ? ?Transfers ?Overall transfer level: Needs assistance ?Equipment used: Rolling walker (2 wheels) ?Transfers: Sit to/from Stand ?Sit to Stand: Min guard ?  ?  ?  ?  ?  ?General  transfer comment: min/guard for safety. cues for hand placement and assist to advance RLE prior to descent ?  ? ?Ambulation/Gait ?Ambulation/Gait assistance: Min guard, Min assist ?Gait Distance (Feet): 50 Feet ?Assistive device: Rolling walker (2 wheels) ?Gait Pattern/deviations: Step-to pattern, Decreased stance time - right ?Gait velocity: decr ?  ?  ?General Gait Details: cues for sequence, step to gait and RW position. min assist to steady with turns. pt prefers to lead with LLE at times ? ? ?Stairs ?  ?  ?  ?  ?  ? ? ?Wheelchair Mobility ?  ? ?Modified Rankin (Stroke Patients Only) ?  ? ? ?  ?Balance   ?  ?  ?  ?  ?  ?  ?  ?  ?  ?  ?  ?  ?  ?  ?  ?  ?  ?  ?  ? ?  ?Cognition Arousal/Alertness: Awake/alert ?Behavior During Therapy: [facility] for tasks assessed/performed ?Overall Cognitive Status: Within Functional Limits for tasks assessed ?  ?  ?  ?  ?  ?  ?  ?  ?  ?  ?  ?  ?  ?  ?  ?  ?  ?  ?  ? ?  ?Exercises Total Joint Exercises ?Ankle Circles/Pumps: AROM, Both, 10 reps ?Quad Sets: AROM, Both, 10 reps ?Heel Slides: AAROM, Right, 10 reps ?Hip ABduction/ADduction: AROM, AAROM, Right, 5 reps ?Straight Leg Raises: AAROM, 10 reps, Limitations ?Straight Leg Raises Limitations: rest break needed after 5 reps d/t pain and muscle fatigue ? ?  ?General Comments   ?  ?  ? ?Pertinent Vitals/Pain Pain Assessment ?Pain Assessment: 0-10 ?Pain Score: 5  ?Pain  Location: right knee ?Pain Descriptors / Indicators: Aching, Grimacing, Sore ?Pain Intervention(s): Limited activity within patient's tolerance, Monitored during session, Premedicated before session, Repositioned, Ice applied  ? ? ?Home Living   ?  ?  ?  ?  ?  ?  ?  ?  ?  ?   ?  ?Prior Function    ?  ?  ?   ? ?PT Goals (current goals can now be found in the care plan section) Acute Rehab PT Goals ?Patient Stated Goal: home ?PT Goal Formulation: With patient ?Time For Goal Achievement: 03/01/22 ?Potential to Achieve Goals: Good ?Progress towards PT goals: Progressing toward  goals ? ?  ?Frequency ? ? ? 7X/week ? ? ? ?  ?PT Plan Current plan remains appropriate  ? ? ?Co-evaluation   ?  ?  ?  ?  ? ?  ?AM-PAC PT "6 Clicks" Mobility   ?Outcome Measure ? Help needed turning from your back to your side while in a flat bed without using bedrails?: A Little ?Help needed moving from lying on your back to sitting on the side of a flat bed without using bedrails?: A Little ?Help needed moving to and from a bed to a chair (including a wheelchair)?: A Little ?Help needed standing up from a chair using your arms (e.g., wheelchair or bedside chair)?: A Little ?Help needed to walk in hospital room?: A Little ?Help needed climbing 3-5 steps with a railing? : A Lot ?6 Click Score: 17 ? ?  ?End of Session Equipment Utilized During Treatment: Gait belt;Right knee immobilizer ?Activity Tolerance: Patient tolerated treatment well ?Patient left: with call bell/phone within reach;in chair;with chair alarm set;with family/visitor present ?Nurse Communication: Mobility status ?PT Visit Diagnosis: Other abnormalities of gait and mobility (R26.89);Difficulty in walking, not elsewhere classified (R26.2) ?  ? ? ?Time: 9381-8299 ?PT Time Calculation (min) (ACUTE ONLY): 24 min ? ?Charges:  $Gait Training: 23-37 mins          ?          ? Grant Edwards, PT ? ?Acute Rehab Dept [facility]) [phone] ?Pager [phone] ? ?02/23/2022 ? ? ? ?Grant Edwards ?02/23/2022, 3:35 PM ? ?

## 2022-02-24 DIAGNOSIS — M1711 Unilateral primary osteoarthritis, right knee: Secondary | ICD-10-CM | POA: Diagnosis not present

## 2022-02-24 LAB — CBC
HCT: 37.5 % — ABNORMAL LOW (ref 39.0–52.0)
Hemoglobin: 12.1 g/dL — ABNORMAL LOW (ref 13.0–17.0)
MCH: 29.1 pg (ref 26.0–34.0)
MCHC: 32.3 g/dL (ref 30.0–36.0)
MCV: 90.1 fL (ref 80.0–100.0)
Platelets: 179 10*3/uL (ref 150–400)
RBC: 4.16 MIL/uL — ABNORMAL LOW (ref 4.22–5.81)
RDW: 14.9 % (ref 11.5–15.5)
WBC: 10.5 10*3/uL (ref 4.0–10.5)
nRBC: 0 % (ref 0.0–0.2)

## 2022-02-24 LAB — BASIC METABOLIC PANEL
Anion gap: 8 (ref 5–15)
BUN: 26 mg/dL — ABNORMAL HIGH (ref 8–23)
CO2: 24 mmol/L (ref 22–32)
Calcium: 9 mg/dL (ref 8.9–10.3)
Chloride: 106 mmol/L (ref 98–111)
Creatinine, Ser: 0.99 mg/dL (ref 0.61–1.24)
GFR, Estimated: >60 mL/min (ref >60)
Glucose, Bld: 138 mg/dL — ABNORMAL HIGH (ref 70–99)
Potassium: 3.9 mmol/L (ref 3.5–5.1)
Sodium: 138 mmol/L (ref 135–145)

## 2022-02-24 NOTE — Progress Notes (Signed)
Physical Therapy Treatment ?Patient Details ?Name: Grant Edwards ?MRN: [id number] ?DOB: November 11, 1940 ?Today's Date: 02/24/2022 ? ? 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 PMH: HTN, gout, AAA ? ?  ?PT Comments  ? ? POD # 2 am session ?Assisted OOB to amb in hallway required increased effort and time.  General Comments: AxO x 2 but required repeat instructions and was easily distracted (pain) had multiple repeat questions. General bed mobility comments: demonstarted and instructed on use of belt to self assist LE off bed with increased time and effort. General transfer comment: 50% VC's on proper tech and hand placemnt as well as increased time/effort due to pain and fatigue. General Gait Details: decreased amb distance due to effort.  Required repeat 50% VC's on proper sequencing as well as proper walker to self distance.  Very slow gait with short shuffled steps. Then returned to room to perform some TE's following HEP handout.  Instructed on proper tech, freq as well as use of ICE.   ?Pt will need another PT session with family prior to D/C to home later today. ?  ?Recommendations for follow up therapy are one component of a multi-disciplinary discharge planning process, led by the attending physician.  Recommendations may be updated based on patient status, additional functional criteria and insurance authorization. ? ?Follow Up Recommendations ? Follow physician's recommendations for discharge plan and follow up therapies ?  ?  ?Assistance Recommended at Discharge Intermittent Supervision/Assistance  ?Patient can return home with the following A little help with walking and/or transfers;A little help with bathing/dressing/bathroom;Assistance with cooking/housework;Assist for transportation;Help with stairs or ramp for entrance ?  ?Equipment Recommendations ? Rolling walker (2 wheels);BSC/3in1  ?  ?Recommendations for Other Services   ? ? ?  ?Precautions / Restrictions Precautions ?Precautions:  Knee;Fall ?Precaution Comments: instructed no pillow under knee ?Restrictions ?Weight Bearing Restrictions: No ?RLE Weight Bearing: Weight bearing as tolerated  ?  ? ?Mobility ? Bed Mobility ?Overal bed mobility: Needs Assistance ?Bed Mobility: Supine to Sit ?  ?  ?Supine to sit: Min assist, HOB elevated ?  ?  ?General bed mobility comments: demonstarted and instructed on use of belt to self assist LE off bed with increased time and effort. ?  ? ?Transfers ?Overall transfer level: Needs assistance ?Equipment used: Rolling walker (2 wheels) ?Transfers: Sit to/from Stand ?Sit to Stand: Min guard, Min assist ?  ?  ?  ?  ?  ?General transfer comment: 50% VC's on proper tech and hand placemnt as well as increased time/effort due to pain and fatigue. ?  ? ?Ambulation/Gait ?Ambulation/Gait assistance: Min guard, Min assist ?Gait Distance (Feet): 32 Feet ?Assistive device: Rolling walker (2 wheels) ?Gait Pattern/deviations: Step-to pattern, Decreased stance time - right ?Gait velocity: decreased ?  ?  ?General Gait Details: decreased amb distance due to effort.  Required repeat 50% VC's on proper sequencing as well as proper walker to self distance.  Very slow gait with short shuffled steps. ? ? ?Stairs ?  ?  ?  ?  ?  ? ? ?Wheelchair Mobility ?  ? ?Modified Rankin (Stroke Patients Only) ?  ? ? ?  ?Balance   ?  ?  ?  ?  ?  ?  ?  ?  ?  ?  ?  ?  ?  ?  ?  ?  ?  ?  ?  ? ?  ?Cognition Arousal/Alertness: Awake/alert ?Behavior During Therapy: [facility] for tasks assessed/performed ?Overall Cognitive Status:  Within Functional Limits for tasks assessed ?  ?  ?  ?  ?  ?  ?  ?  ?  ?  ?  ?  ?  ?  ?  ?  ?General Comments: AxO x 2 but required repeat instructions and was easily distracted (pain) had multiple repeat questions. ?  ?  ? ?  ?Exercises  Total Knee Replacement TE's following HEP handout ?10 reps B LE ankle pumps ?05 reps towel squeezes ?05 reps knee presses ?05 reps heel slides  ?05 reps SAQ's ?05 reps SLR's ?05 reps ABD ?Educated on  use of gait belt to assist with TE's ?Followed by ICE ? ? ?  ?General Comments   ?  ?  ? ?Pertinent Vitals/Pain Pain Assessment ?Pain Assessment: 0-10 ?Pain Score: 7  ?Pain Location: right knee ?Pain Descriptors / Indicators: Aching, Grimacing, Sore, Operative site guarding ?Pain Intervention(s): Monitored during session, Premedicated before session, Repositioned, Ice applied  ? ? ?Home Living   ?  ?  ?  ?  ?  ?  ?  ?  ?  ?   ?  ?Prior Function    ?  ?  ?   ? ?PT Goals (current goals can now be found in the care plan section)   ? ?  ?Frequency ? ? ?   ? ? ? ?  ?PT Plan Current plan remains appropriate  ? ? ?Co-evaluation   ?  ?  ?  ?  ? ?  ?AM-PAC PT "6 Clicks" Mobility   ?Outcome Measure ? Help needed turning from your back to your side while in a flat bed without using bedrails?: A Little ?Help needed moving from lying on your back to sitting on the side of a flat bed without using bedrails?: A Little ?Help needed moving to and from a bed to a chair (including a wheelchair)?: A Little ?Help needed standing up from a chair using your arms (e.g., wheelchair or bedside chair)?: A Little ?Help needed to walk in hospital room?: A Little ?  ?6 Click Score: 15 ? ?  ?End of Session Equipment Utilized During Treatment: Gait belt ?Activity Tolerance: Patient limited by fatigue;Patient limited by pain ?Patient left: with call bell/phone within reach;in chair;with chair alarm set ?Nurse Communication: Mobility status ?PT Visit Diagnosis: Other abnormalities of gait and mobility (R26.89);Difficulty in walking, not elsewhere classified (R26.2) ?  ? ? ?Time: 4801-6553 ?PT Time Calculation (min) (ACUTE ONLY): 47 min ? ?Charges:  $Gait Training: 8-22 mins ?$Therapeutic Exercise: 8-22 mins ?$Therapeutic Activity: 8-22 mins          ?          ? ?Rica Koyanagi  PTA ?Acute  Rehabilitation Services ?Pager      [phone] ?Office      [phone] ? ?

## 2022-02-24 NOTE — Progress Notes (Signed)
Physical Therapy Treatment ?Patient Details ?Name: Grant Edwards ?MRN: [id number] ?DOB: 12-11-1940 ?Today's Date: 02/24/2022 ? ? 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 PMH: HTN, gout, AAA ? ?  ?PT Comments  ? ? POD # 2 pm session ?Spouse present during session.  General transfer comment: 25% VC's on proper tech and hand placemnt as well as increased time/effort due to pain and fatigue. General Gait Details: had pt amd with Spouse "hands on" with VC's on safe handling and use of gait belt.  Caution with turns and back steps.  Slow but steady gait with excessive lean on walker.  Limited distance by fatigue. General bed mobility comments: demonstarted and instructed on use of belt to self assist LE back onto bed with increased time and effort. Then returned to room to perform some TE's following HEP handout.  Instructed on proper tech, freq as well as use of ICE.   ?Addressed all mobility questions, discussed appropriate activity, educated on use of ICE.  Pt ready for D/C to home. ?  ?Recommendations for follow up therapy are one component of a multi-disciplinary discharge planning process, led by the attending physician.  Recommendations may be updated based on patient status, additional functional criteria and insurance authorization. ? ?Follow Up Recommendations ? Follow physician's recommendations for discharge plan and follow up therapies ?  ?  ?Assistance Recommended at Discharge Intermittent Supervision/Assistance  ?Patient can return home with the following A little help with walking and/or transfers;A little help with bathing/dressing/bathroom;Assistance with cooking/housework;Assist for transportation;Help with stairs or ramp for entrance ?  ?Equipment Recommendations ? Rolling walker (2 wheels);BSC/3in1  ?  ?Recommendations for Other Services   ? ? ?  ?Precautions / Restrictions Precautions ?Precautions: Knee;Fall ?Precaution Comments: instructed no pillow under  knee ?Restrictions ?Weight Bearing Restrictions: No ?RLE Weight Bearing: Weight bearing as tolerated  ?  ? ?Mobility ? Bed Mobility ?Overal bed mobility: Needs Assistance ?Bed Mobility: Sit to Supine ?  ?  ?Supine to sit: Min assist, HOB elevated ?Sit to supine: Min guard, Min assist ?  ?General bed mobility comments: demonstarted and instructed on use of belt to self assist LE back onto bed with increased time and effort. ?  ? ?Transfers ?Overall transfer level: Needs assistance ?Equipment used: Rolling walker (2 wheels) ?Transfers: Sit to/from Stand ?Sit to Stand: Supervision, Min guard ?  ?  ?  ?  ?  ?General transfer comment: 25% VC's on proper tech and hand placemnt as well as increased time/effort due to pain and fatigue. ?  ? ?Ambulation/Gait ?Ambulation/Gait assistance: Min guard, Min assist ?Gait Distance (Feet): 37 Feet ?Assistive device: Rolling walker (2 wheels) ?Gait Pattern/deviations: Step-to pattern, Decreased stance time - right ?Gait velocity: decreased ?  ?  ?General Gait Details: had pt amd with Spouse "hands on" with VC's on safe handling and use of gait belt.  Caution with turns and back steps.  Slow but steady gait with excessive lean on walker.  Limited distance by fatigue. ? ? ?Stairs ?Stairs:  [employer] members built a RAMP for him) ?  ?  ?  ?  ? ? ?Wheelchair Mobility ?  ? ?Modified Rankin (Stroke Patients Only) ?  ? ? ?  ?Balance   ?  ?  ?  ?  ?  ?  ?  ?  ?  ?  ?  ?  ?  ?  ?  ?  ?  ?  ?  ? ?  ?Cognition Arousal/Alertness: Awake/alert ?  Behavior During Therapy: [facility] for tasks assessed/performed ?Overall Cognitive Status: Within Functional Limits for tasks assessed ?  ?  ?  ?  ?  ?  ?  ?  ?  ?  ?  ?  ?  ?  ?  ?  ?General Comments: AxO x 2 but required repeat instructions and was easily distracted (pain) had multiple repeat questions. ?  ?  ? ?  ?Exercises   ? ?  ?General Comments   ?  ?  ? ?Pertinent Vitals/Pain Pain Assessment ?Pain Assessment: 0-10 ?Pain Score: 7  ?Pain Location: right  knee ?Pain Descriptors / Indicators: Aching, Grimacing, Sore, Operative site guarding ?Pain Intervention(s): Monitored during session, Premedicated before session, Repositioned, Ice applied  ? ? ?Home Living   ?  ?  ?  ?  ?  ?  ?  ?  ?  ?   ?  ?Prior Function    ?  ?  ?   ? ?PT Goals (current goals can now be found in the care plan section)   ? ?  ?Frequency ? ? ?   ? ? ? ?  ?PT Plan Current plan remains appropriate  ? ? ?Co-evaluation   ?  ?  ?  ?  ? ?  ?AM-PAC PT "6 Clicks" Mobility   ?Outcome Measure ? Help needed turning from your back to your side while in a flat bed without using bedrails?: A Little ?Help needed moving from lying on your back to sitting on the side of a flat bed without using bedrails?: A Little ?Help needed moving to and from a bed to a chair (including a wheelchair)?: A Little ?Help needed standing up from a chair using your arms (e.g., wheelchair or bedside chair)?: A Little ?Help needed to walk in hospital room?: A Little ?  ?6 Click Score: 15 ? ?  ?End of Session Equipment Utilized During Treatment: Gait belt ?Activity Tolerance: Patient limited by fatigue;Patient limited by pain ?Patient left: in bed ?Nurse Communication: Mobility status ?PT Visit Diagnosis: Other abnormalities of gait and mobility (R26.89);Difficulty in walking, not elsewhere classified (R26.2) ?  ? ? ?Time: 7673-4193 ?PT Time Calculation (min) (ACUTE ONLY): 38 min ? ?Charges:  $Gait Training: 8-22 mins ?$Therapeutic Activity: 8-22 mins ?$Self Care/Home Management: 8-22          ?          ? ?Rica Koyanagi  PTA ?Acute  Rehabilitation Services ?Pager      [phone] ?Office      [phone] ? ? ?

## 2022-02-24 NOTE — Progress Notes (Signed)
? ?  Subjective: ?2 Days Post-Op Procedure(s) (LRB): ?TOTAL KNEE ARTHROPLASTY (Right) ?Patient reports pain as mild.   ?Patient seen in rounds for Dr. Wynelle Link.  ?Had issues with pain last night, is hesitant to take the stronger pain medications. Feeling somewhat better this AM. Had slower progression with physical therapy yesterday. ?Plan is to go Home after hospital stay. ? ?Objective: ?Vital signs in last 24 hours: ?Temp:  [97.7 ?F (36.5 ?C)-97.8 ?F (36.6 ?C)] 97.7 ?F (36.5 ?C) (04/19 0434) ?Pulse Rate:  [47-58] 47 (04/19 0434) ?Resp:  [17-18] 18 (04/19 0434) ?BP: (117-128)/(65-73) 124/68 (04/19 0434) ?SpO2:  [92 %-96 %] 96 % (04/19 0434) ? ?Intake/Output from previous day: ? ?Intake/Output Summary (Last 24 hours) at 02/24/2022 0843 ?Last data filed at 02/24/2022 0600 ?Gross per 24 hour  ?Intake 480 ml  ?Output 1675 ml  ?Net -1195 ml  ?  ?Intake/Output this shift: ?No intake/output data recorded. ? ?Labs: ?Recent Labs  ?  02/23/22 ?6222 02/24/22 ?0336  ?HGB 12.3* 12.1*  ? ?Recent Labs  ?  02/23/22 ?9798 02/24/22 ?0336  ?WBC 9.6 10.5  ?RBC 4.19* 4.16*  ?HCT 38.0* 37.5*  ?PLT 173 179  ? ?Recent Labs  ?  02/23/22 ?9211 02/24/22 ?0336  ?NA 136 138  ?K 4.2 3.9  ?CL 105 106  ?CO2 24 24  ?BUN 21 26*  ?CREATININE 1.05 0.99  ?GLUCOSE 123* 138*  ?CALCIUM 8.8* 9.0  ? ?No results for input(s): LABPT, INR in the last 72 hours. ? ?Exam: ?General - Patient is Alert and Oriented ?Extremity - Neurologically intact ?Neurovascular intact ?Sensation intact distally ?Dorsiflexion/Plantar flexion intact ?Dressing/Incision - clean, dry, no drainage ?Motor Function - intact, moving foot and toes well on exam.  ? ?Past Medical History:  ?Diagnosis Date  ? AAA (abdominal aortic aneurysm) ([place])   ? abdominal and Acending Aortic   ? Arthritis   ? Asthma   ? PRN inhaler  ? Claustrophobia   ? Coronary artery disease   ? Eczema   ? Gout   ? History of kidney stones   ? Hx of adenomatous colonic polyps   ? Hyperlipidemia   ? Hypertension   ?  Macular degeneration   ? Pneumonia   ? 81 years old  ? Sleep apnea   ? CPAP  13 setting  ? ? ?Assessment/Plan: ?2 Days Post-Op Procedure(s) (LRB): ?TOTAL KNEE ARTHROPLASTY (Right) ?Principal Problem: ?  OA (osteoarthritis) of knee ?Active Problems: ?  Osteoarthritis of right knee ? ?Estimated body mass index is 32.59 kg/m? as calculated from the following: ?  Height as of this encounter: 5' 11.5" (1.816 m). ?  Weight as of this encounter: 107.5 kg. ?Up with therapy ? ?DVT Prophylaxis -  Plavix ?Weight-bearing as tolerated ? ?Plan for discharge later today if pain controlled and cleared with physical therapy. ? ?Theresa Duty, PA-C ?Orthopedic Surgery ?[phone] ?02/24/2022, 8:43 AM ? ?

## 2022-02-24 NOTE — Progress Notes (Signed)
Discharge order in place.  PT pending discharge, will have 2nd session this pm. ?

## 2022-02-24 NOTE — Progress Notes (Signed)
Discharge instructions given to patient, patient's friend Kendrick Ranch, and patient's son Thaxton Pelley.  Education emphasized on surgical incision site care, PT activities, signs of infection to watch for, and pain management.  Patient and family verbalized understanding.  Encouraged to call the doctor for questions.  Discharged home. ?

## 2022-02-28 ENCOUNTER — Emergency Department (HOSPITAL_COMMUNITY): Payer: Medicare Other

## 2022-02-28 ENCOUNTER — Emergency Department (HOSPITAL_COMMUNITY)
Admission: EM | Admit: 2022-02-28 | Discharge: 2022-02-28 | Disposition: A | Discharge status: Home / Self Care | Payer: Medicare Other | Attending: Emergency Medicine | Admitting: Emergency Medicine

## 2022-02-28 ENCOUNTER — Emergency Department (HOSPITAL_BASED_OUTPATIENT_CLINIC_OR_DEPARTMENT_OTHER)
Admit: 2022-02-28 | Discharge: 2022-02-28 | Disposition: A | Discharge status: Home / Self Care | Payer: Medicare Other | Attending: Emergency Medicine | Admitting: Emergency Medicine

## 2022-02-28 ENCOUNTER — Encounter (HOSPITAL_COMMUNITY): Payer: Self-pay | Admitting: Emergency Medicine

## 2022-02-28 DIAGNOSIS — I82431 Acute embolism and thrombosis of right popliteal vein: Secondary | ICD-10-CM

## 2022-02-28 DIAGNOSIS — I82491 Acute embolism and thrombosis of other specified deep vein of right lower extremity: Secondary | ICD-10-CM | POA: Diagnosis not present

## 2022-02-28 DIAGNOSIS — L03115 Cellulitis of right lower limb: Secondary | ICD-10-CM | POA: Insufficient documentation

## 2022-02-28 DIAGNOSIS — T889XXA Complication of surgical and medical care, unspecified, initial encounter: Secondary | ICD-10-CM | POA: Insufficient documentation

## 2022-02-28 DIAGNOSIS — Z9889 Other specified postprocedural states: Secondary | ICD-10-CM

## 2022-02-28 DIAGNOSIS — Z96651 Presence of right artificial knee joint: Secondary | ICD-10-CM | POA: Diagnosis not present

## 2022-02-28 DIAGNOSIS — Y793 Surgical instruments, materials and orthopedic devices (including sutures) associated with adverse incidents: Secondary | ICD-10-CM | POA: Insufficient documentation

## 2022-02-28 DIAGNOSIS — Z7902 Long term (current) use of antithrombotics/antiplatelets: Secondary | ICD-10-CM | POA: Diagnosis not present

## 2022-02-28 DIAGNOSIS — Z79899 Other long term (current) drug therapy: Secondary | ICD-10-CM | POA: Insufficient documentation

## 2022-02-28 DIAGNOSIS — M79604 Pain in right leg: Secondary | ICD-10-CM | POA: Diagnosis present

## 2022-02-28 DIAGNOSIS — R609 Edema, unspecified: Secondary | ICD-10-CM

## 2022-02-28 LAB — COMPREHENSIVE METABOLIC PANEL
ALT: 15 U/L (ref 0–44)
AST: 27 U/L (ref 15–41)
Albumin: 3.3 g/dL — ABNORMAL LOW (ref 3.5–5.0)
Alkaline Phosphatase: 54 U/L (ref 38–126)
Anion gap: 7 (ref 5–15)
BUN: 18 mg/dL (ref 8–23)
CO2: 24 mmol/L (ref 22–32)
Calcium: 8.9 mg/dL (ref 8.9–10.3)
Chloride: 106 mmol/L (ref 98–111)
Creatinine, Ser: 0.82 mg/dL (ref 0.61–1.24)
GFR, Estimated: >60 mL/min (ref >60)
Glucose, Bld: 118 mg/dL — ABNORMAL HIGH (ref 70–99)
Potassium: 4.8 mmol/L (ref 3.5–5.1)
Sodium: 137 mmol/L (ref 135–145)
Total Bilirubin: 1.5 mg/dL — ABNORMAL HIGH (ref 0.3–1.2)
Total Protein: 7 g/dL (ref 6.5–8.1)

## 2022-02-28 LAB — CBC WITH DIFFERENTIAL/PLATELET
Abs Immature Granulocytes: 0.02 10*3/uL (ref 0.00–0.07)
Basophils Absolute: 0 10*3/uL (ref 0.0–0.1)
Basophils Relative: 0 %
Eosinophils Absolute: 0.3 10*3/uL (ref 0.0–0.5)
Eosinophils Relative: 4 %
HCT: 38.8 % — ABNORMAL LOW (ref 39.0–52.0)
Hemoglobin: 12.6 g/dL — ABNORMAL LOW (ref 13.0–17.0)
Immature Granulocytes: 0 %
Lymphocytes Relative: 25 %
Lymphs Abs: 1.8 10*3/uL (ref 0.7–4.0)
MCH: 29.1 pg (ref 26.0–34.0)
MCHC: 32.5 g/dL (ref 30.0–36.0)
MCV: 89.6 fL (ref 80.0–100.0)
Monocytes Absolute: 0.9 10*3/uL (ref 0.1–1.0)
Monocytes Relative: 13 %
Neutro Abs: 4.1 10*3/uL (ref 1.7–7.7)
Neutrophils Relative %: 58 %
Platelets: 176 10*3/uL (ref 150–400)
RBC: 4.33 MIL/uL (ref 4.22–5.81)
RDW: 15.1 % (ref 11.5–15.5)
WBC: 7.1 10*3/uL (ref 4.0–10.5)
nRBC: 0 % (ref 0.0–0.2)

## 2022-02-28 LAB — LACTIC ACID, PLASMA: Lactic Acid, Venous: 1.5 mmol/L (ref 0.5–1.9)

## 2022-02-28 MED ORDER — CEFADROXIL 500 MG PO CAPS: 500.0000 mg | ORAL_CAPSULE | Freq: Two times a day (BID) | ORAL | 0 refills | Status: DC

## 2022-02-28 MED ORDER — RIVAROXABAN (XARELTO) VTE STARTER PACK (15 & 20 MG): ORAL_TABLET | ORAL | 0 refills | Status: DC

## 2022-02-28 NOTE — ED Provider Notes (Signed)
?[facility] DEPT ?Provider Note ? ? ?CSN: [id number] ?Arrival date & time: 02/28/22  0935 ? ?  ? ?History ? ?Chief Complaint  ?Patient presents with  ? Post-op Problem  ? ? ?Johnedward Brodrick is a 81 y.o. male. ? ?The history is provided by the patient, the spouse and medical records. No language interpreter was used.  ? ?81 year old male with known history of right knee arthritis who had a total right knee replacement by Dr. Wynelle Link last week presenting with complaints of right leg pain.  Patient report he had a normal knee surgery and felt better the next day.  He is not undergoing physical therapy and 4 days ago he noticed progressive worsening pain and swelling about his right knee and leg.  Pain is becoming more unbearable described as sharp achy throbbing pain but furthermore he noticed increased swelling about the right leg which she is concerned for potential blood clot.  He does not complain of any fever or chills no chest pain shortness of breath or productive cough no hemoptysis.  He is currently on Plavix.  He has been taking home opiate medication with some relief.  He does not endorse any numbness.  He did accidentally dropped a can of [employer] on his right knee yesterday. ? ?Home Medications ?Prior to Admission medications   ?Medication Sig Start Date End Date Taking? Authorizing Provider  ?PRESCRIPTION MEDICATION Inhale into the lungs at bedtime.   Yes [provider]  ?albuterol (VENTOLIN HFA) 108 (90 Base) MCG/ACT inhaler Inhale 2 puffs into the lungs every 4 (four) hours as needed for wheezing or shortness of breath. 08/29/17   Charlies Silvers, MD  ?allopurinol (ZYLOPRIM) 300 MG tablet Take 300 mg by mouth in the morning. 02/25/20   [provider]  ?atorvastatin (LIPITOR) 20 MG tablet Take 20 mg by mouth in the morning.    [provider]  ?Azelastine HCl 137 MCG/SPRAY SOLN Place 1 spray into both nostrils daily as needed. 02/28/22   [provider]  ?Cholecalciferol (VITAMIN D3) 250 MCG (10000 UT) capsule Take 10,000 Units by mouth every 3 (three) days.    [provider]  ?clobetasol cream (TEMOVATE) 7.25 % Apply 1 application. topically daily as needed. 02/11/22   [provider]  ?clopidogrel (PLAVIX) 75 MG tablet Take 75 mg by mouth daily.    [provider]  ?colchicine 0.6 MG tablet Take 0.6-1.2 mg by mouth daily as needed (gout flares.).    [provider]  ?Evolocumab (REPATHA SURECLICK) 366 MG/ML SOAJ Inject 140 mg into the skin every 14 (fourteen) days.    [provider]  ?ezetimibe (ZETIA) 10 MG tablet Take 10 mg by mouth in the morning. 01/21/20   [provider]  ?fluticasone (FLONASE) 50 MCG/ACT nasal spray Place 1-2 sprays into both nostrils daily as needed for allergies or rhinitis.    [provider]  ?furosemide (LASIX) 20 MG tablet Take 20 mg by mouth in the morning. 02/25/20   [provider]  ?hydroxypropyl methylcellulose / hypromellose (ISOPTO TEARS / GONIOVISC) 2.5 % ophthalmic solution Place 1-2 drops into both eyes 3 (three) times daily as needed (dry/irritated eyes.).    [provider]  ?lisinopril (PRINIVIL,ZESTRIL) 40 MG tablet Take 40 mg by mouth in the morning.    [provider]  ?methocarbamol (ROBAXIN) 500 MG tablet Take 1 tablet (500 mg total) by mouth every 6 (six) hours as needed for muscle spasms. 02/23/22   Edmisten,  Kristie L, PA  ?metoprolol succinate (TOPROL-XL) 25 MG 24 hr tablet Take 25 mg by mouth in the morning.    [provider]  ?Multiple Vitamins-Minerals (PRESERVISION AREDS 2 PO) Take 1 tablet by mouth in the morning and at bedtime.    [provider]  ?oxyCODONE (OXY IR/ROXICODONE) 5 MG immediate release tablet Take 1-2 tablets (5-10 mg total) by mouth every 6 (six) hours as needed for severe pain. 02/23/22   Edmisten, Ok Anis, PA  ?traMADol (ULTRAM) 50 MG tablet Take 1-2 tablets (50-100 mg  total) by mouth every 6 (six) hours as needed for moderate pain. 02/23/22   Edmisten, Kristie L, PA  ?triamcinolone cream (KENALOG) 0.1 % Apply 1 application. topically 2 (two) times daily as needed. 10/04/21   [provider]  ?   ? ?Allergies    ?Aspirin, Azithromycin, Nsaids, and Erythromycin   ? ?Review of Systems   ?Review of Systems  ?All other systems reviewed and are negative. ? ?Physical Exam ?Updated Vital Signs ?BP (!) 148/91 (BP Location: Right Arm)   Pulse (!) 107   Temp 97.9 ?F (36.6 ?C) (Oral)   Resp 18   SpO2 98%  ?Physical Exam ?Vitals and nursing note reviewed.  ?Constitutional:   ?   General: He is not in acute distress. ?   Appearance: He is well-developed. He is obese.  ?HENT:  ?   Head: Atraumatic.  ?Eyes:  ?   Conjunctiva/sclera: Conjunctivae normal.  ?Cardiovascular:  ?   Rate and Rhythm: Normal rate and regular rhythm.  ?   Pulses: Normal pulses.  ?   Heart sounds: Normal heart sounds.  ?Musculoskeletal:     ?   General: Tenderness (Right lower extremity: Erythema and tenderness about the right knee.  Well-appearing surgical site.  Decreased knee flexion secondary to pain.  3+ edema to right lower extremity from dorsum of foot extending towards the knee with intact pedal pulses) present.  ?   Cervical back: Neck supple.  ?Skin: ?   Findings: No rash.  ?Neurological:  ?   Mental Status: He is alert.  ? ? ? ? ? ? ?ED Results / Procedures / Treatments   ?Labs ?(all labs ordered are listed, but only abnormal results are displayed) ?Labs Reviewed  ?COMPREHENSIVE METABOLIC PANEL - Abnormal; Notable for the following components:  ?    Result Value  ? Glucose, Bld 118 (*)   ? Albumin 3.3 (*)   ? Total Bilirubin 1.5 (*)   ? All other components within normal limits  ?CBC WITH DIFFERENTIAL/PLATELET - Abnormal; Notable for the following components:  ? Hemoglobin 12.6 (*)   ? HCT 38.8 (*)   ? All other components within normal limits  ?LACTIC ACID, PLASMA  ?LACTIC ACID, PLASMA   ? ? ?EKG ?None ? ?Radiology ?DG Knee 2 Views Right ? ?Result Date: 02/28/2022 ?CLINICAL DATA:  Knee pain status post total knee replacement 6 days ago. EXAM: RIGHT KNEE - 1-2 VIEW COMPARISON:  None. FINDINGS: Status post total right knee arthroplasty. No perihardware lucency is seen to indicate hardware failure or loosening. Moderate joint effusion. No acute fracture or dislocation. Moderate vascular calcifications. IMPRESSION: Status post total right knee arthroplasty without evidence of hardware failure. Electronically Signed   By: Yvonne Kendall M.D.   On: 02/28/2022 11:41  ? ?VAS [place] LOWER EXTREMITY VENOUS (DVT) (7a-7p) ? ?Result Date: 02/28/2022 ? Lower Venous DVT Study Patient Name:  RODRIQUES BADIE  Date of Exam:   02/28/2022 Medical  Rec #: [id number]        Accession #:    [id number] Date of Birth: [birth year]        Patient Gender: M Patient Age:   69 years Exam Location:  [facility] Procedure:      VAS [place] LOWER EXTREMITY VENOUS (DVT) Referring Phys: Gertie Fey Nalin Mazzocco --------------------------------------------------------------------------------  Indications: S/p right knee replacement x6 days, right lower extremity edema x4 days.  Limitations: Poor ultrasound/tissue interface. Comparison Study: No prior study Performing Technologist: Maudry Mayhew MHA, RDMS, RVT, RDCS  Examination Guidelines: A complete evaluation includes B-mode imaging, spectral Doppler, color Doppler, and power Doppler as needed of all accessible portions of each vessel. Bilateral testing is considered an integral part of a complete examination. Limited examinations for reoccurring indications may be performed as noted. The reflux portion of the exam is performed with the patient in reverse Trendelenburg.  +---------+---------------+---------+-----------+----------+--------------+ RIGHT    CompressibilityPhasicitySpontaneityPropertiesThrombus Aging +---------+---------------+---------+-----------+----------+--------------+  CFV      Full           Yes      Yes                                 +---------+---------------+---------+-----------+----------+--------------+ SFJ      Full                                                        +---------+-

## 2022-02-28 NOTE — ED Triage Notes (Signed)
Patient reports R knee replaced last week. After two days, swelling started in R leg to foot. Redness surrounding dressing.  ?

## 2022-02-28 NOTE — Discharge Instructions (Signed)
You have been diagnosed with a blood clot involving your right lower extremity.  This is likely precipitated by recent surgery.  Please discontinue your Plavix and start taking Xarelto as prescribed.  Discussed this with your primary care doctor to help coordinate the right medication and further work-up and management of your blood clot.  There's redness to your leg concerning for potential skin infection.  Take antibiotic as prescribed.  Follow-up with your orthopedic doctor for further care.  Return if you have any concern and specifically if you develop chest pain or shortness of breath or productive cough. ?

## 2022-02-28 NOTE — Progress Notes (Signed)
Right lower extremity venous duplex completed. ?Refer to "CV Proc" under chart review to view preliminary results. ? ?Preliminary results discussed with Domenic Moras, PA-C. ? ?02/28/2022 12:22 PM ?Kelby Aline., MHA, RVT, RDCS, RDMS   ?

## 2022-03-01 NOTE — Discharge Summary (Signed)
Patient ID: ?Grant Edwards ?MRN: [id number] ?DOB/AGE: 1941-04-26 81 y.o. ? ?Admit date: 02/22/2022 ?Discharge date: 02/24/2022 ? ?Admission Diagnoses:  ?Principal Problem: ?  OA (osteoarthritis) of knee ?Active Problems: ?  Osteoarthritis of right knee ? ? ?Discharge Diagnoses:  ?Same ? ?Past Medical History:  ?Diagnosis Date  ? AAA (abdominal aortic aneurysm) ([place])   ? abdominal and Acending Aortic   ? Arthritis   ? Asthma   ? PRN inhaler  ? Claustrophobia   ? Coronary artery disease   ? Eczema   ? Gout   ? History of kidney stones   ? Hx of adenomatous colonic polyps   ? Hyperlipidemia   ? Hypertension   ? Macular degeneration   ? Pneumonia   ? 81 years old  ? Sleep apnea   ? CPAP  13 setting  ? ? ?Surgeries: Procedure(s): ?TOTAL KNEE ARTHROPLASTY on 02/22/2022 ?  ?Consultants:  ? ?Discharged Condition: Improved ? ?Hospital Course: Grant Edwards is an 81 y.o. male who was admitted 02/22/2022 for operative treatment ofOA (osteoarthritis) of knee. Patient has severe unremitting pain that affects sleep, daily activities, and work/hobbies. After pre-op clearance the patient was taken to the operating room on 02/22/2022 and underwent  Procedure(s): ?TOTAL KNEE ARTHROPLASTY.   ? ?Patient was given perioperative antibiotics:  ?Anti-infectives (From admission, onward)  ? ? Start     Dose/Rate Route Frequency Ordered Stop  ? 02/22/22 1600  ceFAZolin (ANCEF) IVPB 2g/100 mL premix       ? 2 g ?200 mL/hr over 30 Minutes Intravenous Every 6 hours 02/22/22 1343 02/22/22 2202  ? 02/22/22 0700  ceFAZolin (ANCEF) IVPB 2g/100 mL premix       ? 2 g ?200 mL/hr over 30 Minutes Intravenous On call to O.R. 02/22/22 4193 02/22/22 0935  ? ?  ?  ? ?Patient was given sequential compression devices, early ambulation, and chemoprophylaxis to prevent DVT. ? ?Patient benefited maximally from hospital stay and there were no complications.   ? ?Recent vital signs: No data found.  ? ?Recent laboratory studies:  ?Recent Labs  ?  02/28/22 ?1014  ?WBC  7.1  ?HGB 12.6*  ?HCT 38.8*  ?PLT 176  ?NA 137  ?K 4.8  ?CL 106  ?CO2 24  ?BUN 18  ?CREATININE 0.82  ?GLUCOSE 118*  ?CALCIUM 8.9  ? ? ? ?Discharge Medications:   ?Allergies as of 02/24/2022   ? ?   Reactions  ? Aspirin Other (See Comments), Shortness Of Breath  ? asthma  ? Azithromycin Rash  ? Nsaids Shortness Of Breath  ? Renal impact  ? Erythromycin Rash  ? ?  ? ?  ?Medication List  ?  ? ?TAKE these medications   ? ?albuterol 108 (90 Base) MCG/ACT inhaler ?Commonly known as: Ventolin HFA ?Inhale 2 puffs into the lungs every 4 (four) hours as needed for wheezing or shortness of breath. ?  ?allopurinol 300 MG tablet ?Commonly known as: ZYLOPRIM ?Take 300 mg by mouth in the morning. ?  ?atorvastatin 20 MG tablet ?Commonly known as: LIPITOR ?Take 20 mg by mouth in the morning. ?  ?colchicine 0.6 MG tablet ?Take 0.6-1.2 mg by mouth daily as needed (gout flares.). ?  ?ezetimibe 10 MG tablet ?Commonly known as: ZETIA ?Take 10 mg by mouth in the morning. ?  ?fluticasone 50 MCG/ACT nasal spray ?Commonly known as: FLONASE ?Place 1 spray into both nostrils at bedtime. ?  ?furosemide 20 MG tablet ?Commonly known as: LASIX ?Take 20 mg by mouth in  the morning. ?  ?lisinopril 40 MG tablet ?Commonly known as: ZESTRIL ?Take 40 mg by mouth in the morning. ?  ?methocarbamol 500 MG tablet ?Commonly known as: ROBAXIN ?Take 1 tablet (500 mg total) by mouth every 6 (six) hours as needed for muscle spasms. ?  ?metoprolol succinate 25 MG 24 hr tablet ?Commonly known as: TOPROL-XL ?Take 25 mg by mouth in the morning. ?  ?oxyCODONE 5 MG immediate release tablet ?Commonly known as: Oxy IR/ROXICODONE ?Take 1-2 tablets (5-10 mg total) by mouth every 6 (six) hours as needed for severe pain. ?  ?PRESERVISION AREDS 2 PO ?Take 1 tablet by mouth in the morning and at bedtime. ?  ?traMADol 50 MG tablet ?Commonly known as: ULTRAM ?Take 1-2 tablets (50-100 mg total) by mouth every 6 (six) hours as needed for moderate pain. ?  ?Vitamin D3 250 MCG (10000  UT) capsule ?Take 10,000 Units by mouth every 3 (three) days. ?  ? ?  ? ?  ?  ? ? ?  ?Discharge Care Instructions  ?(From admission, onward)  ?  ? ? ?  ? ?  Start     Ordered  ? 02/23/22 0000  Weight bearing as tolerated       ? 02/23/22 0813  ? 02/23/22 0000  Change dressing       ?Comments: You may remove the bulky bandage (ACE wrap and gauze) two days after surgery. You will have an adhesive waterproof bandage underneath. Leave this in place until your first follow-up appointment.  ? 02/23/22 0813  ? ?  ?  ? ?  ? ? ?Diagnostic Studies: DG Knee 2 Views Right ? ?Result Date: 02/28/2022 ?CLINICAL DATA:  Knee pain status post total knee replacement 6 days ago. EXAM: RIGHT KNEE - 1-2 VIEW COMPARISON:  None. FINDINGS: Status post total right knee arthroplasty. No perihardware lucency is seen to indicate hardware failure or loosening. Moderate joint effusion. No acute fracture or dislocation. Moderate vascular calcifications. IMPRESSION: Status post total right knee arthroplasty without evidence of hardware failure. Electronically Signed   By: Yvonne Kendall M.D.   On: 02/28/2022 11:41  ? ?VAS [place] LOWER EXTREMITY VENOUS (DVT) (7a-7p) ? ?Result Date: 02/28/2022 ? Lower Venous DVT Study Patient Name:  Grant Edwards  Date of Exam:   02/28/2022 Medical Rec #: [id number]        Accession #:    [id number] Date of Birth: 06-24-1941        Patient Gender: M Patient Age:   89 years Exam Location:  [facility] Procedure:      VAS [place] LOWER EXTREMITY VENOUS (DVT) Referring Phys: Domenic Moras --------------------------------------------------------------------------------  Indications: S/p right knee replacement x6 days, right lower extremity edema x4 days.  Limitations: Poor ultrasound/tissue interface. Comparison Study: No prior study Performing Technologist: Maudry Mayhew MHA, RDMS, RVT, RDCS  Examination Guidelines: A complete evaluation includes B-mode imaging, spectral Doppler, color Doppler, and power Doppler as needed  of all accessible portions of each vessel. Bilateral testing is considered an integral part of a complete examination. Limited examinations for reoccurring indications may be performed as noted. The reflux portion of the exam is performed with the patient in reverse Trendelenburg.  +---------+---------------+---------+-----------+----------+--------------+ RIGHT    CompressibilityPhasicitySpontaneityPropertiesThrombus Aging +---------+---------------+---------+-----------+----------+--------------+ CFV      Full           Yes      Yes                                 +---------+---------------+---------+-----------+----------+--------------+  SFJ      Full                                                        +---------+---------------+---------+-----------+----------+--------------+ FV Prox  Full                                                        +---------+---------------+---------+-----------+----------+--------------+ FV Mid   Full                                                        +---------+---------------+---------+-----------+----------+--------------+ FV DistalFull                                                        +---------+---------------+---------+-----------+----------+--------------+ PFV      Full                                                        +---------+---------------+---------+-----------+----------+--------------+ POP      None                    No                   Acute          +---------+---------------+---------+-----------+----------+--------------+ PTV      None                    No                   Acute          +---------+---------------+---------+-----------+----------+--------------+ PERO     None                    No                   Acute          +---------+---------------+---------+-----------+----------+--------------+ Gastroc  None                    No                   Acute           +---------+---------------+---------+-----------+----------+--------------+   +----+---------------+---------+-----------+----------+--------------+ LEFTCompressibilityPhasicitySpontaneityPropertiesThrombus Aging +----+---------------+---------+-----------+----------+--

## 2022-03-05 ENCOUNTER — Other Ambulatory Visit: Payer: Self-pay

## 2022-03-05 ENCOUNTER — Emergency Department (HOSPITAL_COMMUNITY): Payer: Medicare Other

## 2022-03-05 ENCOUNTER — Encounter (HOSPITAL_COMMUNITY): Payer: Self-pay

## 2022-03-05 ENCOUNTER — Inpatient Hospital Stay (HOSPITAL_COMMUNITY)
Admission: EM | Admit: 2022-03-05 | Discharge: 2022-03-07 | DRG: 863 | Disposition: A | Discharge status: Home / Self Care | Payer: Medicare Other | Attending: Orthopaedic Surgery | Admitting: Orthopaedic Surgery

## 2022-03-05 DIAGNOSIS — L03115 Cellulitis of right lower limb: Secondary | ICD-10-CM | POA: Diagnosis present

## 2022-03-05 DIAGNOSIS — M25469 Effusion, unspecified knee: Secondary | ICD-10-CM | POA: Diagnosis not present

## 2022-03-05 DIAGNOSIS — J45909 Unspecified asthma, uncomplicated: Secondary | ICD-10-CM | POA: Diagnosis present

## 2022-03-05 DIAGNOSIS — G4730 Sleep apnea, unspecified: Secondary | ICD-10-CM | POA: Diagnosis present

## 2022-03-05 DIAGNOSIS — Z9889 Other specified postprocedural states: Secondary | ICD-10-CM

## 2022-03-05 DIAGNOSIS — T8141XA Infection following a procedure, superficial incisional surgical site, initial encounter: Principal | ICD-10-CM | POA: Diagnosis present

## 2022-03-05 DIAGNOSIS — G8918 Other acute postprocedural pain: Principal | ICD-10-CM

## 2022-03-05 DIAGNOSIS — Z79899 Other long term (current) drug therapy: Secondary | ICD-10-CM

## 2022-03-05 DIAGNOSIS — Y838 Other surgical procedures as the cause of abnormal reaction of the patient, or of later complication, without mention of misadventure at the time of the procedure: Secondary | ICD-10-CM | POA: Diagnosis present

## 2022-03-05 DIAGNOSIS — I714 Abdominal aortic aneurysm, without rupture, unspecified: Secondary | ICD-10-CM | POA: Diagnosis present

## 2022-03-05 DIAGNOSIS — I2510 Atherosclerotic heart disease of native coronary artery without angina pectoris: Secondary | ICD-10-CM | POA: Diagnosis present

## 2022-03-05 DIAGNOSIS — M1990 Unspecified osteoarthritis, unspecified site: Secondary | ICD-10-CM | POA: Diagnosis present

## 2022-03-05 DIAGNOSIS — Z96651 Presence of right artificial knee joint: Secondary | ICD-10-CM | POA: Diagnosis present

## 2022-03-05 DIAGNOSIS — Z886 Allergy status to analgesic agent status: Secondary | ICD-10-CM

## 2022-03-05 DIAGNOSIS — I10 Essential (primary) hypertension: Secondary | ICD-10-CM | POA: Diagnosis present

## 2022-03-05 DIAGNOSIS — Z881 Allergy status to other antibiotic agents status: Secondary | ICD-10-CM

## 2022-03-05 DIAGNOSIS — E785 Hyperlipidemia, unspecified: Secondary | ICD-10-CM | POA: Diagnosis present

## 2022-03-05 DIAGNOSIS — Z7901 Long term (current) use of anticoagulants: Secondary | ICD-10-CM

## 2022-03-05 DIAGNOSIS — Z86718 Personal history of other venous thrombosis and embolism: Secondary | ICD-10-CM

## 2022-03-05 DIAGNOSIS — I82431 Acute embolism and thrombosis of right popliteal vein: Secondary | ICD-10-CM

## 2022-03-05 LAB — COMPREHENSIVE METABOLIC PANEL
ALT: 21 U/L (ref 0–44)
AST: 34 U/L (ref 15–41)
Albumin: 3.6 g/dL (ref 3.5–5.0)
Alkaline Phosphatase: 67 U/L (ref 38–126)
Anion gap: 11 (ref 5–15)
BUN: 14 mg/dL (ref 8–23)
CO2: 25 mmol/L (ref 22–32)
Calcium: 9.3 mg/dL (ref 8.9–10.3)
Chloride: 106 mmol/L (ref 98–111)
Creatinine, Ser: 0.9 mg/dL (ref 0.61–1.24)
GFR, Estimated: >60 mL/min (ref >60)
Glucose, Bld: 106 mg/dL — ABNORMAL HIGH (ref 70–99)
Potassium: 3.9 mmol/L (ref 3.5–5.1)
Sodium: 142 mmol/L (ref 135–145)
Total Bilirubin: 1.4 mg/dL — ABNORMAL HIGH (ref 0.3–1.2)
Total Protein: 7.5 g/dL (ref 6.5–8.1)

## 2022-03-05 LAB — CBC WITH DIFFERENTIAL/PLATELET
Abs Immature Granulocytes: 0.04 10*3/uL (ref 0.00–0.07)
Basophils Absolute: 0 10*3/uL (ref 0.0–0.1)
Basophils Relative: 1 %
Eosinophils Absolute: 0.2 10*3/uL (ref 0.0–0.5)
Eosinophils Relative: 2 %
HCT: 36.4 % — ABNORMAL LOW (ref 39.0–52.0)
Hemoglobin: 11.8 g/dL — ABNORMAL LOW (ref 13.0–17.0)
Immature Granulocytes: 1 %
Lymphocytes Relative: 18 %
Lymphs Abs: 1.5 10*3/uL (ref 0.7–4.0)
MCH: 28.6 pg (ref 26.0–34.0)
MCHC: 32.4 g/dL (ref 30.0–36.0)
MCV: 88.3 fL (ref 80.0–100.0)
Monocytes Absolute: 0.7 10*3/uL (ref 0.1–1.0)
Monocytes Relative: 8 %
Neutro Abs: 5.6 10*3/uL (ref 1.7–7.7)
Neutrophils Relative %: 70 %
Platelets: 286 10*3/uL (ref 150–400)
RBC: 4.12 MIL/uL — ABNORMAL LOW (ref 4.22–5.81)
RDW: 14.9 % (ref 11.5–15.5)
WBC: 7.9 10*3/uL (ref 4.0–10.5)
nRBC: 0 % (ref 0.0–0.2)

## 2022-03-05 LAB — LACTIC ACID, PLASMA
Lactic Acid, Venous: 2.1 mmol/L (ref 0.5–1.9)
Lactic Acid, Venous: 1.5 mmol/L (ref 0.5–1.9)

## 2022-03-05 MED ORDER — HYDROMORPHONE HCL 1 MG/ML IJ SOLN
1.0000 mg | Freq: Once | INTRAMUSCULAR | Status: AC
  Administered 2022-03-05: 1 mg via INTRAVENOUS
  Filled 2022-03-05: qty 1

## 2022-03-05 MED ORDER — ONDANSETRON HCL 4 MG/2ML IJ SOLN
4.0000 mg | Freq: Once | INTRAMUSCULAR | Status: AC
  Administered 2022-03-05: 4 mg via INTRAVENOUS
  Filled 2022-03-05: qty 2

## 2022-03-05 MED ORDER — HYDROMORPHONE HCL 1 MG/ML IJ SOLN
1.0000 mg | Freq: Once | INTRAMUSCULAR | Status: AC
Start: 2022-03-05 — End: 2022-03-05
  Administered 2022-03-05: 1 mg via INTRAVENOUS
  Filled 2022-03-05: qty 1

## 2022-03-05 NOTE — ED Provider Notes (Addendum)
?[facility] ?Provider Note ? ? ?CSN: [id number] ?Arrival date & time: 03/05/22  1901 ? ?  ? ?History ? ?Chief Complaint  ?Patient presents with  ? Post-op Problem  ? ? ?Grant Edwards is a 81 y.o. male. ? 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 Ricki Rodriguez on April 17.  Postoperative there is been concerns about about skin infection and patient's been on doxycycline now for 4 days.  Also developed a deep vein thrombosis in that leg behind the right knee is on Xarelto for that.  Patient also with some increased swelling in both lower extremities has been on Lasix.  No chest pain no shortness of breath no upper respiratory infections.  Did develop a fever at home today and was told to come in.  Was seen by orthopedics just earlier this morning.  No recent imaging. ? ?Past medical history significant for abdominal aortic aneurysm hypertension asthma hyperlipidemia sleep apnea on CPAP and listing of coronary artery disease. ? ? ?  ? ?Home Medications ?Prior to Admission medications   ?Medication Sig Start Date End Date Taking? Authorizing Provider  ?acetaminophen (TYLENOL) 650 MG CR tablet Take 650 mg by mouth every 6 (six) hours as needed for pain.    [provider]  ?albuterol (VENTOLIN HFA) 108 (90 Base) MCG/ACT inhaler Inhale 2 puffs into the lungs every 4 (four) hours as needed for wheezing or shortness of breath. 08/29/17   Charlies Silvers, MD  ?allopurinol (ZYLOPRIM) 300 MG tablet Take 300 mg by mouth in the morning. 02/25/20   [provider]  ?atorvastatin (LIPITOR) 20 MG tablet Take 20 mg by mouth in the morning.    [provider]  ?Azelastine HCl 137 MCG/SPRAY SOLN Place 1 spray into both nostrils daily as needed (congestion). 02/28/22   [provider]  ?cefadroxil (DURICEF) 500 MG capsule Take 1 capsule (500 mg total) by mouth 2 (two) times daily. 02/28/22   Domenic Moras, PA-C  ?Cholecalciferol (VITAMIN D3) 250 MCG (10000  UT) capsule Take 10,000 Units by mouth every 3 (three) days.    [provider]  ?clobetasol cream (TEMOVATE) 3.53 % Apply 1 application. topically daily as needed (break outs). 02/11/22   [provider]  ?colchicine 0.6 MG tablet Take 0.6-1.2 mg by mouth daily as needed (gout flares.).    [provider]  ?Evolocumab (REPATHA SURECLICK) 299 MG/ML SOAJ Inject 140 mg into the skin every 21 ( twenty-one) days.    [provider]  ?ezetimibe (ZETIA) 10 MG tablet Take 10 mg by mouth in the morning. 01/21/20   [provider]  ?fluticasone (FLONASE) 50 MCG/ACT nasal spray Place 1 spray into both nostrils at bedtime.    [provider]  ?furosemide (LASIX) 20 MG tablet Take 20 mg by mouth in the morning. 02/25/20   [provider]  ?lisinopril (PRINIVIL,ZESTRIL) 40 MG tablet Take 40 mg by mouth in the morning.    [provider]  ?methocarbamol (ROBAXIN) 500 MG tablet Take 1 tablet (500 mg total) by mouth every 6 (six) hours as needed for muscle spasms. 02/23/22   Edmisten, Ok Anis, PA  ?metoprolol succinate (TOPROL-XL) 25 MG 24 hr tablet Take 25 mg by mouth in the morning.    [provider]  ?Multiple Vitamins-Minerals (PRESERVISION AREDS 2 PO) Take 1 tablet by mouth in the morning and at bedtime.    [provider]  ?oxyCODONE (OXY IR/ROXICODONE) 5 MG immediate release tablet Take 1-2  tablets (5-10 mg total) by mouth every 6 (six) hours as needed for severe pain. ?Patient taking differently: Take 5 mg by mouth every 6 (six) hours as needed for severe pain. 02/23/22   Edmisten, Ok Anis, PA  ?Polyvinyl Alcohol-Povidone (REFRESH OP) Place 1 drop into both eyes daily as needed.    [provider]  ?PRESCRIPTION MEDICATION Inhale into the lungs at bedtime.    [provider]  ?RIVAROXABAN Alveda Reasons) VTE STARTER PACK (15 & 20 MG) Follow package directions: Take one '15mg'$  tablet by mouth twice a day. On day 22, switch to  one '20mg'$  tablet once a day. Take with food. 02/28/22   Domenic Moras, PA-C  ?traMADol (ULTRAM) 50 MG tablet Take 1-2 tablets (50-100 mg total) by mouth every 6 (six) hours as needed for moderate pain. ?Patient taking differently: Take 50 mg by mouth every 6 (six) hours as needed for moderate pain. 02/23/22   Edmisten, Kristie L, PA  ?triamcinolone cream (KENALOG) 0.1 % Apply 1 application. topically 2 (two) times daily as needed (break outs). 10/04/21   [provider]  ?   ? ?Allergies    ?Aspirin, Azithromycin, Nsaids, and Erythromycin   ? ?Review of Systems   ?Review of Systems  ?Constitutional:  Negative for chills and fever.  ?HENT:  Negative for ear pain and sore throat.   ?Eyes:  Negative for pain and visual disturbance.  ?Respiratory:  Negative for cough and shortness of breath.   ?Cardiovascular:  Positive for leg swelling. Negative for chest pain and palpitations.  ?Gastrointestinal:  Negative for abdominal pain and vomiting.  ?Genitourinary:  Negative for dysuria and hematuria.  ?Musculoskeletal:  Positive for joint swelling. Negative for arthralgias and back pain.  ?Skin:  Negative for color change and rash.  ?Neurological:  Negative for seizures and syncope.  ?All other systems reviewed and are negative. ? ?Physical Exam ?Updated Vital Signs ?BP 139/78   Pulse 75   Temp 98 ?F (36.7 ?C) (Oral)   Resp 13   SpO2 100%  ?Physical Exam ?Vitals and nursing note reviewed.  ?Constitutional:   ?   General: He is not in acute distress. ?   Appearance: Normal appearance. He is well-developed. He is not ill-appearing.  ?HENT:  ?   Head: Normocephalic and atraumatic.  ?Eyes:  ?   Extraocular Movements: Extraocular movements intact.  ?   Conjunctiva/sclera: Conjunctivae normal.  ?   Pupils: Pupils are equal, round, and reactive to light.  ?Cardiovascular:  ?   Rate and Rhythm: Normal rate and regular rhythm.  ?   Heart sounds: No murmur heard. ?Pulmonary:  ?   Effort: Pulmonary effort is normal. No  respiratory distress.  ?   Breath sounds: Normal breath sounds. No wheezing, rhonchi or rales.  ?Abdominal:  ?   Palpations: Abdomen is soft.  ?   Tenderness: There is no abdominal tenderness.  ?Musculoskeletal:     ?   General: Swelling and tenderness present.  ?   Cervical back: Normal range of motion and neck supple.  ?   Right lower leg: Edema present.  ?   Left lower leg: Edema present.  ?   Comments: Increased swelling to the right lower extremity.  Large area of erythema pictures show that it has not changed significantly in the past few days.  But has increased warmth and tenderness.  Incision still intact.  Probably some chronic skin changes may be in the anterior part of the tib-fib area.  Good cap  refill to the toes.  Left lower extremity has a little bit of edema but no erythema.  Good cap refill to that foot.  ?Skin: ?   General: Skin is warm and dry.  ?   Capillary Refill: Capillary refill takes less than 2 seconds.  ?Neurological:  ?   General: No focal deficit present.  ?   Mental Status: He is alert and oriented to person, place, and time.  ?Psychiatric:     ?   Mood and Affect: Mood normal.  ? ? ?ED Results / Procedures / Treatments   ?Labs ?(all labs ordered are listed, but only abnormal results are displayed) ?Labs Reviewed  ?CBC WITH DIFFERENTIAL/PLATELET - Abnormal; Notable for the following components:  ?    Result Value  ? RBC 4.12 (*)   ? Hemoglobin 11.8 (*)   ? HCT 36.4 (*)   ? All other components within normal limits  ?COMPREHENSIVE METABOLIC PANEL - Abnormal; Notable for the following components:  ? Glucose, Bld 106 (*)   ? Total Bilirubin 1.4 (*)   ? All other components within normal limits  ?LACTIC ACID, PLASMA - Abnormal; Notable for the following components:  ? Lactic Acid, Venous 2.1 (*)   ? All other components within normal limits  ?CULTURE, BLOOD (ROUTINE X 2)  ?CULTURE, BLOOD (ROUTINE X 2)  ?LACTIC ACID, PLASMA  ? ? ?EKG ?EKG Interpretation ? ?Date/Time:  Friday March 05 2022  20:01:50 EDT ?Ventricular Rate:  79 ?PR Interval:  206 ?QRS Duration: 95 ?QT Interval:  407 ?QTC Calculation: 449 ?R Axis:   35 ?Text Interpretation: Sinus rhythm Supraventricular bigeminy Low voltage, precordial leads

## 2022-03-05 NOTE — Discharge Instructions (Signed)
Keep your appointment with the orthopedic doctor at [facility] as scheduled for Tuesday.  Take your pain medication that you have at home. ?

## 2022-03-05 NOTE — ED Triage Notes (Addendum)
Patient had knee surgery 2 weeks ago. Swelling and redness began the next day. They said he had cellulitis, started oral antibiotics. (Doxycycline). He also said he has a blood clot and began Xarelto. Patient stated he had a fever at home and was told to come to the ED. ?

## 2022-03-06 DIAGNOSIS — Z881 Allergy status to other antibiotic agents status: Secondary | ICD-10-CM | POA: Diagnosis not present

## 2022-03-06 DIAGNOSIS — Z86718 Personal history of other venous thrombosis and embolism: Secondary | ICD-10-CM | POA: Diagnosis not present

## 2022-03-06 DIAGNOSIS — E785 Hyperlipidemia, unspecified: Secondary | ICD-10-CM | POA: Diagnosis present

## 2022-03-06 DIAGNOSIS — T8141XA Infection following a procedure, superficial incisional surgical site, initial encounter: Secondary | ICD-10-CM | POA: Diagnosis present

## 2022-03-06 DIAGNOSIS — L03115 Cellulitis of right lower limb: Secondary | ICD-10-CM | POA: Diagnosis present

## 2022-03-06 DIAGNOSIS — M25469 Effusion, unspecified knee: Secondary | ICD-10-CM | POA: Diagnosis present

## 2022-03-06 DIAGNOSIS — Z9889 Other specified postprocedural states: Secondary | ICD-10-CM

## 2022-03-06 DIAGNOSIS — J45909 Unspecified asthma, uncomplicated: Secondary | ICD-10-CM | POA: Diagnosis present

## 2022-03-06 DIAGNOSIS — Z96651 Presence of right artificial knee joint: Secondary | ICD-10-CM | POA: Diagnosis present

## 2022-03-06 DIAGNOSIS — I10 Essential (primary) hypertension: Secondary | ICD-10-CM | POA: Diagnosis present

## 2022-03-06 DIAGNOSIS — Z7901 Long term (current) use of anticoagulants: Secondary | ICD-10-CM | POA: Diagnosis not present

## 2022-03-06 DIAGNOSIS — M1990 Unspecified osteoarthritis, unspecified site: Secondary | ICD-10-CM | POA: Diagnosis present

## 2022-03-06 DIAGNOSIS — I714 Abdominal aortic aneurysm, without rupture, unspecified: Secondary | ICD-10-CM | POA: Diagnosis present

## 2022-03-06 DIAGNOSIS — Z79899 Other long term (current) drug therapy: Secondary | ICD-10-CM | POA: Diagnosis not present

## 2022-03-06 DIAGNOSIS — G4730 Sleep apnea, unspecified: Secondary | ICD-10-CM | POA: Diagnosis present

## 2022-03-06 DIAGNOSIS — I2510 Atherosclerotic heart disease of native coronary artery without angina pectoris: Secondary | ICD-10-CM | POA: Diagnosis present

## 2022-03-06 DIAGNOSIS — Z886 Allergy status to analgesic agent status: Secondary | ICD-10-CM | POA: Diagnosis not present

## 2022-03-06 DIAGNOSIS — Y838 Other surgical procedures as the cause of abnormal reaction of the patient, or of later complication, without mention of misadventure at the time of the procedure: Secondary | ICD-10-CM | POA: Diagnosis present

## 2022-03-06 LAB — SEDIMENTATION RATE: Sed Rate: 65 mm/hr — ABNORMAL HIGH (ref 0–16)

## 2022-03-06 LAB — C-REACTIVE PROTEIN: CRP: 5.3 mg/dL — ABNORMAL HIGH (ref <1.0)

## 2022-03-06 MED ORDER — ATORVASTATIN CALCIUM 20 MG PO TABS
20.0000 mg | ORAL_TABLET | Freq: Every day | ORAL | Status: DC
  Administered 2022-03-06 – 2022-03-07 (×2): 20 mg via ORAL
  Filled 2022-03-06 (×2): qty 1

## 2022-03-06 MED ORDER — DOCUSATE SODIUM 100 MG PO CAPS
100.0000 mg | ORAL_CAPSULE | Freq: Two times a day (BID) | ORAL | Status: DC
  Administered 2022-03-06 – 2022-03-07 (×4): 100 mg via ORAL
  Filled 2022-03-06 (×4): qty 1

## 2022-03-06 MED ORDER — LISINOPRIL 20 MG PO TABS
40.0000 mg | ORAL_TABLET | Freq: Every day | ORAL | Status: DC
  Administered 2022-03-06 – 2022-03-07 (×2): 40 mg via ORAL
  Filled 2022-03-06 (×2): qty 2

## 2022-03-06 MED ORDER — ACETAMINOPHEN 325 MG PO TABS: 325.0000 mg | ORAL_TABLET | Freq: Four times a day (QID) | ORAL | Status: DC | PRN

## 2022-03-06 MED ORDER — ONDANSETRON HCL 4 MG/2ML IJ SOLN: 4.0000 mg | Freq: Four times a day (QID) | INTRAMUSCULAR | Status: DC | PRN

## 2022-03-06 MED ORDER — DOXYCYCLINE HYCLATE 100 MG PO TABS
100.0000 mg | ORAL_TABLET | Freq: Two times a day (BID) | ORAL | Status: DC
  Administered 2022-03-06 – 2022-03-07 (×4): 100 mg via ORAL
  Filled 2022-03-06 (×4): qty 1

## 2022-03-06 MED ORDER — HYDROCODONE-ACETAMINOPHEN 7.5-325 MG PO TABS
1.0000 | ORAL_TABLET | ORAL | Status: DC | PRN
  Administered 2022-03-06 – 2022-03-07 (×6): 1 via ORAL
  Filled 2022-03-06 (×6): qty 1

## 2022-03-06 MED ORDER — EZETIMIBE 10 MG PO TABS
10.0000 mg | ORAL_TABLET | Freq: Every day | ORAL | Status: DC
  Administered 2022-03-06 – 2022-03-07 (×2): 10 mg via ORAL
  Filled 2022-03-06 (×2): qty 1

## 2022-03-06 MED ORDER — SODIUM CHLORIDE 0.9 % IV SOLN: INTRAVENOUS | Status: DC | PRN

## 2022-03-06 MED ORDER — RIVAROXABAN 15 MG PO TABS: 15.0000 mg | ORAL_TABLET | Freq: Two times a day (BID) | ORAL | Status: DC

## 2022-03-06 MED ORDER — ALLOPURINOL 300 MG PO TABS
300.0000 mg | ORAL_TABLET | Freq: Every day | ORAL | Status: DC
  Administered 2022-03-06 – 2022-03-07 (×2): 300 mg via ORAL
  Filled 2022-03-06 (×2): qty 1

## 2022-03-06 MED ORDER — BISACODYL 10 MG RE SUPP
10.0000 mg | Freq: Once | RECTAL | Status: AC
  Administered 2022-03-06: 10 mg via RECTAL
  Filled 2022-03-06: qty 1

## 2022-03-06 MED ORDER — ONDANSETRON HCL 4 MG PO TABS: 4.0000 mg | ORAL_TABLET | Freq: Four times a day (QID) | ORAL | Status: DC | PRN

## 2022-03-06 MED ORDER — CEFAZOLIN SODIUM-DEXTROSE 1-4 GM/50ML-% IV SOLN
1.0000 g | Freq: Three times a day (TID) | INTRAVENOUS | Status: AC
  Administered 2022-03-06 – 2022-03-07 (×4): 1 g via INTRAVENOUS
  Filled 2022-03-06 (×4): qty 50

## 2022-03-06 MED ORDER — HYDROCORTISONE 1 % EX CREA
TOPICAL_CREAM | Freq: Three times a day (TID) | CUTANEOUS | Status: DC
Start: 2022-03-06 — End: 2022-03-07
  Administered 2022-03-06: 1 via TOPICAL
  Filled 2022-03-06 (×2): qty 28

## 2022-03-06 MED ORDER — MORPHINE SULFATE (PF) 2 MG/ML IV SOLN: 0.5000 mg | INTRAVENOUS | Status: DC | PRN

## 2022-03-06 MED ORDER — RIVAROXABAN 15 MG PO TABS
15.0000 mg | ORAL_TABLET | Freq: Two times a day (BID) | ORAL | Status: DC
  Administered 2022-03-06 – 2022-03-07 (×4): 15 mg via ORAL
  Filled 2022-03-06 (×4): qty 1

## 2022-03-06 MED ORDER — RIVAROXABAN 10 MG PO TABS: 20.0000 mg | ORAL_TABLET | Freq: Every day | ORAL | Status: DC

## 2022-03-06 MED ORDER — HYDROCODONE-ACETAMINOPHEN 5-325 MG PO TABS: 1.0000 | ORAL_TABLET | ORAL | Status: DC | PRN

## 2022-03-06 MED ORDER — METOPROLOL SUCCINATE ER 25 MG PO TB24
25.0000 mg | ORAL_TABLET | Freq: Every day | ORAL | Status: DC
  Administered 2022-03-06 – 2022-03-07 (×2): 25 mg via ORAL
  Filled 2022-03-06 (×2): qty 1

## 2022-03-06 MED ORDER — FLUTICASONE PROPIONATE 50 MCG/ACT NA SUSP
1.0000 | Freq: Every day | NASAL | Status: DC
  Administered 2022-03-06 (×2): 1 via NASAL
  Filled 2022-03-06: qty 16

## 2022-03-06 MED ORDER — FUROSEMIDE 20 MG PO TABS
20.0000 mg | ORAL_TABLET | Freq: Every day | ORAL | Status: DC
  Administered 2022-03-06 – 2022-03-07 (×2): 20 mg via ORAL
  Filled 2022-03-06 (×2): qty 1

## 2022-03-06 MED ORDER — ACETAMINOPHEN 500 MG PO TABS
500.0000 mg | ORAL_TABLET | Freq: Four times a day (QID) | ORAL | Status: AC
  Administered 2022-03-06 (×3): 500 mg via ORAL
  Filled 2022-03-06 (×3): qty 1

## 2022-03-06 MED ORDER — SENNA 8.6 MG PO TABS
1.0000 | ORAL_TABLET | Freq: Once | ORAL | Status: AC
  Administered 2022-03-06: 8.6 mg via ORAL
  Filled 2022-03-06: qty 1

## 2022-03-06 MED ORDER — CEFADROXIL 500 MG PO CAPS
500.0000 mg | ORAL_CAPSULE | Freq: Two times a day (BID) | ORAL | Status: DC
  Administered 2022-03-06: 500 mg via ORAL
  Filled 2022-03-06 (×3): qty 1

## 2022-03-06 NOTE — H&P (Signed)
? ?Grant Edwards is an 81 y.o. male.   ? ?Chief Complaint: right leg erythema and low grade fever ? ?HPI: 81 y/o male with recent right total knee arthroplasty with Dr. Wynelle Link with complication of DVT and lower leg cellulitis. Pt currently on xarelto for dvt treatment and doxycycline for leg cellulitis. Pt seen by Dr. Wynelle Link yesterday in the clinic and was stable. Pt developed low grade temp of 100.2 at home and come to the emergency room. Labs and vitals were all negative for signs of sepsis.  ?Today patient is feeling better with increased rom/flexion of the right knee. Denies any symptoms overnight. Pain is minimal.  ? ?PCP:  Grant Edwards., MD ? ?PMH: ?Past Medical History:  ?Diagnosis Date  ? AAA (abdominal aortic aneurysm) ([place])   ? abdominal and Acending Aortic   ? Arthritis   ? Asthma   ? PRN inhaler  ? Claustrophobia   ? Coronary artery disease   ? Eczema   ? Gout   ? History of kidney stones   ? Hx of adenomatous colonic polyps   ? Hyperlipidemia   ? Hypertension   ? Macular degeneration   ? Pneumonia   ? 81 years old  ? Sleep apnea   ? CPAP  13 setting  ? ? ?PSes:  ?Allergies  ?Allergen Reactions  ? Aspirin Other (See Comments) and Shortness Of Breath  ?  asthma  ? Azithromycin Rash  ? Nsaids Shortness Of Breath  ?  Renal impact  ? Erythromycin Rash  ? ? ?Medications: ?Current Facility-Administered Medications  ?Medication Dose Route Frequency Provider Last Rate Last Admin  ? acetaminophen (TYLENOL) tablet 325-650 mg  325-650 mg Oral Q6H PRN Armond Hang, MD      ? acetaminophen (TYLENOL) tablet 500 mg  500 mg Oral Q6H Armond Hang, MD   500 mg at 03/06/22 0007  ? allopurinol (ZYLOPRIM) tablet 300 mg  300 mg Oral Daily Armond Hang, MD      ? atorvastatin (LIPITOR) tablet 20 mg  20 mg Oral Daily Armond Hang, MD      ? cefadroxil (DURICEF) capsule 500 mg  500 mg Oral BID Armond Hang, MD   500 mg at 03/06/22 0215  ? docusate sodium (COLACE) capsule 100 mg  100 mg Oral  BID Armond Hang, MD   100 mg at 03/06/22 0205  ? doxycycline (VIBRA-TABS) tablet 100 mg  100 mg Oral BID Armond Hang, MD   100 mg at 03/06/22 0008  ? ezetimibe (ZETIA) tablet 10 mg  10 mg Oral Daily Armond Hang, MD      ? fluticasone (FLONASE) 50 MCG/ACT nasal spray 1 spray  1 spray Each Nare QHS Armond Hang, MD   1 spray at 03/06/22 0205  ? furosemide (LASIX) tablet 20 mg  20 mg Oral Daily Armond Hang, MD      ? HYDROcodone-acetaminophen (NORCO) 7.5-325 MG per tablet 1-2 tablet  1-2 tablet Oral Q4H PRN Armond Hang, MD   1 tablet at 03/06/22 0555  ? HYDROcodone-acetaminophen (NORCO/VICODIN) 5-325 MG per tablet 1-2 tablet  1-2 tablet Oral Q4H PRN Armond Hang, MD      ? lisinopril (ZESTRIL) tablet 40 mg  40 mg Oral Daily Armond Hang, MD      ? metoprolol succinate (TOPROL-XL) 24 hr tablet 25 mg  25 mg Oral Daily Armond Hang, MD      ? morphine (PF) 2 MG/ML injection 0.5-1 mg  0.5-1 mg Intravenous Q2H PRN Armond Hang, MD      ?  ondansetron (ZOFRAN) tablet 4 mg  4 mg Oral Q6H PRN Armond Hang, MD      ? Or  ? ondansetron (ZOFRAN) injection 4 mg  4 mg Intravenous Q6H PRN Armond Hang, MD      ? Rivaroxaban (XARELTO) tablet 15 mg  15 mg Oral BID Armond Hang, MD   15 mg at 03/06/22 0215  ? ? ?Results for orders placed or performed during the hospital encounter of 03/05/22 (from the past 48 hour(s))  ?Culture, blood (Routine X 2) w Reflex to ID Panel     Status: None (Preliminary result)  ? Collection Time: 03/05/22  7:30 PM  ? Specimen: BLOOD  ?Result Value Ref Range  ? Specimen Description    ?  BLOOD LEFT ANTECUBITAL ?Performed at [facility], [place] [address], [place], [place] [address] ?  ? Special Requests    ?  BOTTLES DRAWN AEROBIC AND ANAEROBIC Blood Culture adequate volume ?Performed at [facility], [place] [address], [place], [place] [address] ?  ? Culture    ?  NO GROWTH < 12  HOURS ?Performed at [facility], [place] [address], [place], [place] [address] ?  ? Report Status PENDING   ?CBC with Differential/Platelet     Status: Abnormal  ? Collection Time: 03/05/22  7:37 PM  ?Result Value Ref Range  ? WBC 7.9 4.0 - 10.5 K/uL  ? RBC 4.12 (L) 4.22 - 5.81 MIL/uL  ? Hemoglobin 11.8 (L) 13.0 - 17.0 g/dL  ? HCT 36.4 (L) 39.0 - 52.0 %  ? MCV 88.3 80.0 - 100.0 fL  ? MCH 28.6 26.0 - 34.0 pg  ? MCHC 32.4 30.0 - 36.0 g/dL  ? RDW 14.9 11.5 - 15.5 %  ? Platelets 286 150 - 400 K/uL  ? nRBC 0.0 0.0 - 0.2 %  ? Neutrophils Relative % 70 %  ? Neutro Abs 5.6 1.7 - 7.7 K/uL  ? Lymphocytes Relative 18 %  ? Lymphs Abs 1.5 0.7 - 4.0 K/uL  ? Monocytes Relative 8 %  ? Monocytes Absolute 0.7 0.1 - 1.0 K/uL  ? Eosinophils Relative 2 %  ? Eosinophils Absolute 0.2 0.0 - 0.5 K/uL  ? Basophils Relative 1 %  ? Basophils Absolute 0.0 0.0 - 0.1 K/uL  ? Immature Granulocytes 1 %  ? Abs Immature Granulocytes 0.04 0.00 - 0.07 K/uL  ?  Comment: Performed at [facility], [place] [address], [place],  [address]  ?Comprehensive metabolic panel     Status: Abnormal  ? Collection Time: 03/05/22  7:37 PM  ?Result Value Ref Range  ? Sodium 142 135 - 145 mmol/L  ? Potassium 3.9 3.5 - 5.1 mmol/L  ? Chloride 106 98 - 111 mmol/L  ? CO2 25 22 - 32 mmol/L  ? Glucose, Bld 106 (H) 70 - 99 mg/dL  ?  Comment: Glucose reference range applies only to samples taken after fasting for at least 8 hours.  ? BUN 14 8 - 23 mg/dL  ? Creatinine, Ser 0.90 0.61 - 1.24 mg/dL  ? Calcium 9.3 8.9 - 10.3 mg/dL  ? Total Protein 7.5 6.5 - 8.1 g/dL  ? Albumin 3.6 3.5 - 5.0 g/dL  ? AST 34 15 - 41 U/L  ? ALT 21 0 - 44 U/L  ? Alkaline Phosphatase 67 38 - 126 U/L  ? Total Bilirubin 1.4 (H) 0.3 - 1.2 mg/dL  ? GFR, Estimated >60 >60 mL/min  ?  Comment: (NOTE) ?Calculated  using the CKD-EPI Creatinine Equation (2021) ?  ? Anion gap 11 5 - 15  ?  Comment: Performed at [facility], [place] [address], [place], [place] [address]   ?Lactic acid, plasma     Status: Abnormal  ? Collection Time: 03/05/22  7:37 PM  ?Result Value Ref Range  ? Lactic Acid, Venous 2.1 (HH) 0.5 - 1.9 mmol/L  ?  Comment: CRITICAL RESULT CALLED TO, READ BACK BY AND VERIFIED WITH: ?KADY, EMT.P @ 2042 ON 03/05/2022 BY LBROOKS, MLT ?Performed at [facility], [place] [address], [place], [place] [address] ?  ?Culture, blood (Routine X 2) w Reflex to ID Panel     Status: None (Preliminary result)  ? Collection Time: 03/05/22  8:05 PM  ? Specimen: BLOOD  ?Result Value Ref Range  ? Specimen Description    ?  BLOOD RIGHT ANTECUBITAL ?Performed at [facility], [place] [address], [place], [place] [address] ?  ? Special Requests    ?  BOTTLES DRAWN AEROBIC AND ANAEROBIC Blood Culture adequate volume ?Performed at [facility], [place] [address] [place], [place] [address] ?  ? Culture    ?  NO GROWTH < 12 HOURS ?Performed at [facility], [place] [address] [place], [place] [address] ?  ? Report Status PENDING   ?Sedimentation rate     Status: Abnormal  ? Collection Time: 03/05/22 10:35 PM  ?Result Value Ref Range  ? Sed Rate 65 (H) 0 - 16 mm/hr  ?  Comment: Performed at [facility], [place] [address] [place], [place] [address]  ?C-reactive protein     Status: Abnormal  ? Collection Time: 03/05/22 10:35 PM  ?Result Value Ref Range  ? CRP 5.3 (H) <1.0 mg/dL  ?  Comment: Performed at [facility], [place] [address] [place], [place] [address]  ?Lactic acid, plasma     Status: None  ? Collection Time: 03/05/22 10:40 PM  ?Result Value Ref Range  ? Lactic Acid, Venous 1.5 0.5 - 1.9 mmol/L  ?  Comment: Performed at [facility], [place] [address], [place], [place] [address]  ? ?DG Chest Port 1 View ? ?Result Date: 03/05/2022 ?CLINICAL DATA:  Fever. EXAM: PORTABLE CHEST 1 VIEW COMPARISON:  August 14, 2021 FINDINGS: The heart size and mediastinal contours are within normal limits. Very mild  atelectasis is seen within the bilateral lung bases. There is no evidence of a pleural effusion or pneumothorax. Multilevel degenerative changes seen throughout the thoracic spine. IMPRESSION: Very mild bibasilar atelectasis. Ele

## 2022-03-06 NOTE — Evaluation (Signed)
Occupational Therapy Evaluation ?Patient Details ?Name: Grant Edwards ?MRN: [id number] ?DOB: [birth year] ?Today's Date: 03/06/2022 ? ? ?History of Present Illness Patient is 81 y.o. 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 Pt has postoperative concerns about about skin infection and patient's been on doxycycline now for 4 days.  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  Patient also with some increased swelling in both lower extremities has been on Lasix.  ? ?Clinical Impression ?  ?Patient is a pleasant 81 year old male who was admitted for above. Patient reported increased pain in RLE and discomfort from constipation during session but was still agreeable to participate in session. Patient was noted to have decreased BLE ROM, increased pain, decreased knowledge of AE/AD, decreased standing balance impacting participation in [place]. Patient would continue to benefit from skilled OT services at this time while admitted and after d/c to address noted deficits in order to improve overall safety and independence in ADLs.  ?  ?   ? ?Recommendations for follow up therapy are one component of a multi-disciplinary discharge planning process, led by the attending physician.  Recommendations may be updated based on patient status, additional functional criteria and insurance authorization.  ? ?Follow Up Recommendations ? Follow physician's recommendations for discharge plan and follow up therapies  ?  ?Assistance Recommended at Discharge Frequent or constant Supervision/Assistance  ?Patient can return home with the following A little help with walking and/or transfers;A little help with bathing/dressing/bathroom;Assistance with cooking/housework;Direct supervision/assist for financial management;Assist for transportation;Help with stairs or ramp for entrance;Direct supervision/assist for medications management ? ?  ?Functional Status  Assessment ? Patient has had a recent decline in their functional status and demonstrates the ability to make significant improvements in function in a reasonable and predictable amount of time.  ?Equipment Recommendations ? Other (comment) (total hip kit)  ?  ?Recommendations for Other Services   ? ? ?  ?Precautions / Restrictions Precautions ?Precautions: Knee;Fall ?Required Braces or Orthoses: Knee Immobilizer - Right ?Knee Immobilizer - Right: Discontinue once straight leg raise with < 10 degree lag ?Restrictions ?Weight Bearing Restrictions: No ?RLE Weight Bearing: Weight bearing as tolerated  ? ?  ? ?Mobility Bed Mobility ?  ?  ?  ?  ?  ?  ?  ?General bed mobility comments: patient was up in recliner and returned to the same ?  ? ?Transfers ?  ?  ?  ?  ?  ?  ?  ?  ?  ?  ?  ? ?  ?Balance Overall balance assessment: Mild deficits observed, not formally tested ?  ?  ?  ?  ?  ?  ?  ?  ?  ?  ?  ?  ?  ?  ?  ?  ?  ?  ?   ? ?ADL either performed or assessed with clinical judgement  ? ?ADL Overall ADL's : Needs assistance/impaired ?Eating/Feeding: Set up;Sitting ?  ?Grooming: Dance movement psychotherapist;Set up;Sitting ?  ?Upper Body Bathing: Set up;Sitting ?  ?Lower Body Bathing: Moderate assistance;Sit to/from stand;Sitting/lateral leans ?Lower Body Bathing Details (indicate cue type and reason): simluated reaching tasks with RW ?Upper Body Dressing : Set up;Sitting ?  ?Lower Body Dressing: Moderate assistance;Sit to/from stand;Sitting/lateral leans ?Lower Body Dressing Details (indicate cue type and reason): educated on using AE to don/doff socks with patient able to demonstrate understanding. verbal and visual demonstration on  how to don/doff pants with reacher. patient verbalized understanding. ?Toilet Transfer: Minimal assistance ?Toilet Transfer Details (indicate cue type and reason): patient was min guard for sit to stand to take a few steps from recliner and back with patietn reporting increased pressrure on RLE ?Toileting-  Clothing Manipulation and Hygiene: Minimal assistance;Sit to/from stand ?  ?  ?  ?Functional mobility during ADLs: Minimal assistance ?   ? ? ? ?Vision   ?   ?   ?Perception   ?  ?Praxis   ?  ? ?Pertinent Vitals/Pain Pain Assessment ?Pain Assessment: Faces ?Faces Pain Scale: Hurts little more ?Pain Location: Rt knee ?Pain Descriptors / Indicators: Aching ?Pain Intervention(s): Limited activity within patient's tolerance, Monitored during session, Repositioned  ? ? ? ?Hand Dominance Right ?  ?Extremity/Trunk Assessment Upper Extremity Assessment ?Upper Extremity Assessment: Overall WFL for tasks assessed ?  ?Lower Extremity Assessment ?Lower Extremity Assessment: Defer to PT evaluation ?  ?Cervical / Trunk Assessment ?Cervical / Trunk Assessment: Normal ?  ?Communication Communication ?Communication: No difficulties ?  ?Cognition Arousal/Alertness: Awake/alert ?Behavior During Therapy: [facility] for tasks assessed/performed ?Overall Cognitive Status: Within Functional Limits for tasks assessed ?  ?  ?  ?  ?  ?  ?  ?  ?  ?  ?  ?  ?  ?  ?  ?  ?General Comments: plesant gentleman who was motivated to participate in session ?  ?  ?General Comments    ? ?  ?Exercises   ?  ?Shoulder Instructions    ? ? ?Home Living Family/patient expects to be discharged to:: Private residence ?Living Arrangements: Spouse/significant other ?Available Help at Discharge: Family;Available 24 hours/day ?Type of Home: House ?Home Access: Ramped entrance ?  ?  ?Home Layout: One level ?  ?  ?Bathroom Shower/Tub: Walk-in shower ?  ?Bathroom Toilet: Standard ?Bathroom Accessibility: Yes ?  ?Home Equipment: Cane - single point;Rolling Walker (2 wheels);BSC/3in1;Grab bars - toilet;Grab bars - tub/shower ?  ?Additional Comments: pt has wife for assist at home ?  ? ?  ?Prior Functioning/Environment Prior Level of Function : Independent/Modified Independent ?  ?  ?  ?  ?  ?  ?  ?ADLs Comments: independent prior to surgery ?  ? ?  ?  ?OT Problem List:  Decreased activity tolerance;Impaired balance (sitting and/or standing);Decreased safety awareness;Pain;Decreased knowledge of precautions;Decreased knowledge of use of DME or AE ?  ?   ?OT Treatment/Interventions: Self-care/ADL training;Therapeutic exercise;Neuromuscular education;Energy conservation;DME and/or AE instruction;Therapeutic activities;Balance training;Patient/family education  ?  ?OT Goals(Current goals can be found in the care plan section) Acute Rehab OT Goals ?Patient Stated Goal: to get better ?OT Goal Formulation: With patient ?Time For Goal Achievement: 03/20/22 ?Potential to Achieve Goals: Good  ?OT Frequency: Min 2X/week ?  ? ?Co-evaluation   ?  ?  ?  ?  ? ?  ?AM-PAC OT "6 Clicks" Daily Activity     ?Outcome Measure Help from another person eating meals?: None ?Help from another person taking care of personal grooming?: A Little ?Help from another person toileting, which includes using toliet, bedpan, or urinal?: A Little ?Help from another person bathing (including washing, rinsing, drying)?: A Lot ?Help from another person to put on and taking off regular upper body clothing?: A Little ?Help from another person to put on and taking off regular lower body clothing?: A Lot ?6 Click Score: 17 ?  ?End of Session Equipment Utilized During Treatment: Gait belt;Rolling walker (2 wheels) ?Nurse Communication: Patient requests pain  meds ? ?Activity Tolerance: Patient tolerated treatment well ?Patient left: in chair;with call bell/phone within reach;with chair alarm set ? ?OT Visit Diagnosis: Unsteadiness on feet (R26.81);Pain;History of falling (Z91.81)  ?              ?Time: 7680-8811 ?OT Time Calculation (min): 21 min ?Charges:  OT General Charges ?$OT Visit: 1 Visit ?OT Evaluation ?$OT Eval Moderate Complexity: 1 Mod ? ?Ronneisha Jett OTR/L, MS ?Acute Rehabilitation Department ?Office# [phone] ?Pager# [phone] ? ? ?Feliz Beam Carmin Alvidrez ?03/06/2022, 4:07 PM ?

## 2022-03-06 NOTE — Evaluation (Signed)
Physical Therapy Evaluation ?Patient Details ?Name: Grant Edwards ?MRN: [id number] ?DOB: 1941-07-16 ?Today's Date: 03/06/2022 ? ?History of Present Illness ? Patient is 81 y.o. 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 Pt has postoperative concerns about about skin infection and patient's been on doxycycline now for 4 days.  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  Patient also with some increased swelling in both lower extremities has been on Lasix. ?  ?Clinical Impression ? [name] is 81 y.o. male admitted with above HPI and diagnosis. Patient is currently limited by functional impairments below (see PT problem list). Patient lives with spouse and is modified independent with RW since Rt TKA on 4/17 but has been greatly limited by complicate recovery (see above). He was able to demonstrate safe power up from EOB to RW and min guard/assist required for ~90' gait this session. Patient will benefit from continued skilled PT interventions to address impairments and progress independence with mobility, recommending pt continue with OPPT for TKA recovery. Acute PT will follow and progress as able.    ?   ? ?Recommendations for follow up therapy are one component of a multi-disciplinary discharge planning process, led by the attending physician.  Recommendations may be updated based on patient status, additional functional criteria and insurance authorization. ? ?Follow Up Recommendations Follow physician's recommendations for discharge plan and follow up therapies ? ?  ?Assistance Recommended at Discharge Intermittent Supervision/Assistance  ?Patient can return home with the following ? A little help with walking and/or transfers;A little help with bathing/dressing/bathroom;Assistance with cooking/housework;Assist for transportation;Help with stairs or ramp for entrance ? ?  ?Equipment Recommendations None recommended by PT   ?Recommendations for Other Services ?    ?  ?Functional Status Assessment Patient has had a recent decline in their functional status and demonstrates the ability to make significant improvements in function in a reasonable and predictable amount of time.  ? ?  ?Precautions / Restrictions Precautions ?Precautions: Knee;Fall ?Precaution Comments: instructed no pillow under knee ?Required Braces or Orthoses: Knee Immobilizer - Right ?Knee Immobilizer - Right: Discontinue once straight leg raise with < 10 degree lag ?Restrictions ?Weight Bearing Restrictions: No ?RLE Weight Bearing: Weight bearing as tolerated  ? ?  ? ?Mobility ? Bed Mobility ?Overal bed mobility: Needs Assistance ?Bed Mobility: Supine to Sit ?  ?  ?Supine to sit: HOB elevated, Min guard ?  ?  ?General bed mobility comments: pt able to scoot self to EOB without assist. ?  ? ?Transfers ?Overall transfer level: Needs assistance ?Equipment used: Rolling walker (2 wheels) ?Transfers: Sit to/from Stand ?Sit to Stand: Min guard ?  ?  ?  ?  ?  ?General transfer comment: close guarding for safety with VC's for technique/hand placement on RW for power up. VC's to extend Rt knee when sitting in recliner. ?  ? ?Ambulation/Gait ?Ambulation/Gait assistance: Min guard, Min assist ?Gait Distance (Feet): 90 Feet ?Assistive device: Rolling walker (2 wheels) ?Gait Pattern/deviations: Step-to pattern, Decreased stance time - right, Decreased stride length, Knee flexed in stance - right, Decreased weight shift to right ?Gait velocity: decreased ?  ?  ?General Gait Details: cues for step to pattern and proximity to RW throughout. no LOB noted and pt steady with no buckling at Rt knee. ? ?Stairs ?  ?  ?  ?  ?  ? ?Wheelchair Mobility ?  ? ?Modified Rankin (Stroke  Patients Only) ?  ? ?  ? ?Balance   ?  ?  ?  ?  ?  ?  ?  ?  ?  ?  ?  ?  ?  ?  ?  ?  ?  ?  ?   ? ? ? ?Pertinent Vitals/Pain Pain Assessment ?Pain Assessment: Faces ?Faces Pain Scale: Hurts a little bit ?Pain  Location: Rt knee ?Pain Descriptors / Indicators: Aching ?Pain Intervention(s): Monitored during session, Limited activity within patient's tolerance, Repositioned  ? ? ?Home Living Family/patient expects to be discharged to:: Private residence ?Living Arrangements: Spouse/significant other ?Available Help at Discharge: Family;Available 24 hours/day ?Type of Home: House ?Home Access: Ramped entrance ?  ?  ?  ?Home Layout: One level ?Home Equipment: Cane - single point;Rolling Walker (2 wheels);BSC/3in1;Grab bars - toilet;Grab bars - tub/shower ?Additional Comments: pt has wife for assist at home  ?  ?Prior Function Prior Level of Function : Independent/Modified Independent ?  ?  ?  ?  ?  ?  ?Mobility Comments: amb with cane prior to 4/17, has been using RW since surgery ?  ?  ? ? ?Hand Dominance  ? Dominant Hand: Right ? ?  ?Extremity/Trunk Assessment  ? Upper Extremity Assessment ?Upper Extremity Assessment: Overall WFL for tasks assessed ?  ? ?Lower Extremity Assessment ?Lower Extremity Assessment: Overall WFL for tasks assessed;RLE deficits/detail ?RLE Deficits / Details: pt able to complete SLR with no lag and reduced elevation height ?RLE Sensation: WNL ?RLE Coordination: WNL ?  ? ?Cervical / Trunk Assessment ?Cervical / Trunk Assessment: Normal  ?Communication  ? Communication: No difficulties  ?Cognition Arousal/Alertness: Awake/alert ?Behavior During Therapy: [facility] for tasks assessed/performed ?Overall Cognitive Status: Within Functional Limits for tasks assessed ?  ?  ?  ?  ?  ?  ?  ?  ?  ?  ?  ?  ?  ?  ?  ?  ?General Comments: pt pleasant A&Ox4 ?  ?  ? ?  ?General Comments   ? ?  ?Exercises Total Joint Exercises ?Ankle Circles/Pumps: AROM, Both, 15 reps ?Quad Sets: AROM, Right, 5 reps ?Short Arc Quad: AROM, Right, 5 reps ?Heel Slides: AROM, Right, 5 reps ?Hip ABduction/ADduction: AROM, Right, 5 reps  ? ?Assessment/Plan  ?  ?PT Assessment Patient needs continued PT services  ?PT Problem List Decreased  strength;Decreased mobility;Decreased range of motion;Decreased activity tolerance;Decreased balance;Decreased knowledge of use of DME;Pain;Decreased cognition ? ?   ?  ?PT Treatment Interventions DME instruction;Therapeutic exercise;Gait training;Functional mobility training;Therapeutic activities;Patient/family education   ? ?PT Goals (Current goals can be found in the Care Plan section)  ?Acute Rehab PT Goals ?Patient Stated Goal: finally stop hurting and get home ?PT Goal Formulation: With patient ?Time For Goal Achievement: 03/13/22 ?Potential to Achieve Goals: Good ? ?  ?Frequency Min 3X/week ?  ? ? ?Co-evaluation   ?  ?  ?  ?  ? ? ?  ?AM-PAC PT "6 Clicks" Mobility  ?Outcome Measure Help needed turning from your back to your side while in a flat bed without using bedrails?: A Little ?Help needed moving from lying on your back to sitting on the side of a flat bed without using bedrails?: A Little ?Help needed moving to and from a bed to a chair (including a wheelchair)?: A Little ?Help needed standing up from a chair using your arms (e.g., wheelchair or bedside chair)?: A Little ?Help needed to walk in hospital room?: A Little ?Help needed climbing 3-5 steps  with a railing? : A Little ?6 Click Score: 18 ? ?  ?End of Session Equipment Utilized During Treatment: Gait belt ?Activity Tolerance: Patient tolerated treatment well ?Patient left: in chair;with call bell/phone within reach;with chair alarm set;with SCD's reapplied ?Nurse Communication: Mobility status ?PT Visit Diagnosis: Other abnormalities of gait and mobility (R26.89);Difficulty in walking, not elsewhere classified (R26.2) ?  ? ?Time: 9093-1121 ?PT Time Calculation (min) (ACUTE ONLY): 35 min ? ? ?Charges:   PT Evaluation ?$PT Eval Low Complexity: 1 Low ?PT Treatments ?$Therapeutic Exercise: 8-22 mins ?  ?   ? ? ?Gwynneth Albright PT, DPT ?Acute Rehabilitation Services ?Office [phone] ?Pager [phone]  ? ?Jacques Navy ?03/06/2022, 12:55 PM ? ?

## 2022-03-06 NOTE — Plan of Care (Signed)
Plan of care discussed.   

## 2022-03-06 NOTE — Plan of Care (Signed)
  Problem: Education: Goal: Knowledge of General Education information will improve Description: Including pain rating scale, medication(s)/side effects and non-pharmacologic comfort measures Outcome: Progressing   Problem: Health Behavior/Discharge Planning: Goal: Ability to manage health-related needs will improve Outcome: Progressing   Problem: Pain Managment: Goal: General experience of comfort will improve Outcome: Progressing   

## 2022-03-07 NOTE — Progress Notes (Signed)
Occupational Therapy Treatment ?Patient Details ?Name: Grant Edwards ?MRN: [id number] ?DOB: 1941/11/03 ?Today's Date: 03/07/2022 ? ? ?History of present illness Patient is 81 y.o. 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 Pt has postoperative concerns about about skin infection and patient's been on doxycycline now for 4 days.  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  Patient also with some increased swelling in both lower extremities has been on Lasix. ?  ?OT comments ? Patient was happy to be scheduled to d/c home today and reported having good conversation with MD earlier this AM. Patient was supervision for grooming in standing, toileting tasks and Lb dressing on this date. Patient would continue to benefit from skilled OT services at this time while admitted and after d/c to address noted deficits in order to improve overall safety and independence in ADLs.  ?  ? ?Recommendations for follow up therapy are one component of a multi-disciplinary discharge planning process, led by the attending physician.  Recommendations may be updated based on patient status, additional functional criteria and insurance authorization. ?   ?Follow Up Recommendations ? Follow physician's recommendations for discharge plan and follow up therapies  ?  ?Assistance Recommended at Discharge Intermittent Supervision/Assistance  ?Patient can return home with the following ? A little help with walking and/or transfers;A little help with bathing/dressing/bathroom;Assistance with cooking/housework;Direct supervision/assist for financial management;Assist for transportation;Help with stairs or ramp for entrance;Direct supervision/assist for medications management ?  ?Equipment Recommendations ? Other (comment) (total hip kit)  ?  ?Recommendations for Other Services   ? ?  ?Precautions / Restrictions Precautions ?Precautions: Knee;Fall ?Restrictions ?Weight  Bearing Restrictions: Yes ?RLE Weight Bearing: Weight bearing as tolerated  ? ? ?  ? ?Mobility Bed Mobility ?Overal bed mobility: Needs Assistance ?  ?  ?  ?Supine to sit: Supervision, HOB elevated ?  ?  ?  ?  ? ?Transfers ?  ?  ?  ?  ?  ?  ?  ?  ?  ?  ?  ?  ?Balance Overall balance assessment: Mild deficits observed, not formally tested ?  ?  ?  ?  ?  ?  ?  ?  ?  ?  ?  ?  ?  ?  ?  ?  ?  ?  ?   ? ?ADL either performed or assessed with clinical judgement  ? ?ADL Overall ADL's : Needs assistance/impaired ?Eating/Feeding: Set up;Sitting ?  ?Grooming: Dance movement psychotherapist;Set up;Standing;Oral care ?Grooming Details (indicate cue type and reason): with RW ?  ?  ?  ?  ?  ?  ?Lower Body Dressing: Min guard;Sit to/from stand ?Lower Body Dressing Details (indicate cue type and reason): with no AE to don pants. patient was able to don over feet sitting on EOB. ?Toilet Transfer: Supervision/safety;Rolling walker (2 wheels);BSC/3in1;Ambulation ?Toilet Transfer Details (indicate cue type and reason): with increased time ?Toileting- Clothing Manipulation and Hygiene: Sit to/from stand;Supervision/safety ?  ?  ?  ?  ?  ?  ? ?Extremity/Trunk Assessment   ?  ?  ?  ?  ?  ? ?Vision   ?  ?  ?Perception   ?  ?Praxis   ?  ? ?Cognition Arousal/Alertness: Awake/alert ?Behavior During Therapy: [facility] for tasks assessed/performed ?Overall Cognitive Status: Within Functional Limits for tasks assessed ?  ?  ?  ?  ?  ?  ?  ?  ?  ?  ?  ?  ?  ?  ?  ?  ?  ?  ?  ?   ?  Exercises   ? ?  ?Shoulder Instructions   ? ? ?  ?General Comments    ? ? ?Pertinent Vitals/ Pain       Pain Assessment ?Pain Assessment: Faces ?Faces Pain Scale: Hurts a little bit ?Pain Location: Rt knee ?Pain Descriptors / Indicators: Aching, Discomfort ?Pain Intervention(s): Limited activity within patient's tolerance, Monitored during session ? ?Home Living   ?  ?  ?  ?  ?  ?  ?  ?  ?  ?  ?  ?  ?  ?  ?  ?  ?  ?  ? ?  ?Prior Functioning/Environment    ?  ?  ?  ?   ? ?Frequency ? Min 2X/week   ? ? ? ? ?  ?Progress Toward Goals ? ?OT Goals(current goals can now be found in the care plan section) ? Progress towards OT goals: Progressing toward goals ? ?   ?Plan Discharge plan remains appropriate   ? ?Co-evaluation ? ? ?   ?  ?  ?  ?  ? ?  ?AM-PAC OT "6 Clicks" Daily Activity     ?Outcome Measure ? ? Help from another person eating meals?: None ?Help from another person taking care of personal grooming?: A Little ?Help from another person toileting, which includes using toliet, bedpan, or urinal?: A Little ?Help from another person bathing (including washing, rinsing, drying)?: A Little ?Help from another person to put on and taking off regular upper body clothing?: A Little ?Help from another person to put on and taking off regular lower body clothing?: A Little ?6 Click Score: 19 ? ?  ?End of Session Equipment Utilized During Treatment: Rolling walker (2 wheels) ? ?OT Visit Diagnosis: Unsteadiness on feet (R26.81);Pain;History of falling (Z91.81) ?  ?Activity Tolerance Patient tolerated treatment well ?  ?Patient Left in chair;with call bell/phone within reach;with chair alarm set ?  ?Nurse Communication Mobility status ?  ? ?   ? ?Time: 0818-0849 ?OT Time Calculation (min): 31 min ? ?Charges: OT General Charges ?$OT Visit: 1 Visit ?OT Treatments ?$Self Care/Home Management : 23-37 mins ? ?Molly Fash OTR/L, MS ?Acute Rehabilitation Department ?Office# [phone] ?Pager# [phone] ? ? ?Molly M Fash ?03/07/2022, 8:53 AM ?

## 2022-03-07 NOTE — Progress Notes (Signed)
Physical Therapy Treatment ?Patient Details ?Name: Grant Edwards ?MRN: [id number] ?DOB: 15-Oct-1941 ?Today's Date: 03/07/2022 ? ? ?History of Present Illness Patient is 81 y.o. 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 Pt has postoperative concerns about about skin infection and patient's been on doxycycline now for 4 days.  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  Patient also with some increased swelling in both lower extremities has been on Lasix. ? ?  ?PT Comments  ? ? Re admit knee infection POD # 13 R TKR ?Pt was OOB in recliner with Spouse at bedside.  Assisted with amb in hallway went well.  General Gait Details: tolerated a functional distance with walker plus Spouse.  Slow and steady.  Pain seemed to "ease off" with increased distance. Educated on importance of amb within home as well as frequency.  Educated on HEP as well as R LE elevation and use of ICE.  Addressed all mobility questions.  Pt ready for D/C to home. ?  ?  ?Recommendations for follow up therapy are one component of a multi-disciplinary discharge planning process, led by the attending physician.  Recommendations may be updated based on patient status, additional functional criteria and insurance authorization. ? ?Follow Up Recommendations ? Follow physician's recommendations for discharge plan and follow up therapies ?  ?  ?Assistance Recommended at Discharge Intermittent Supervision/Assistance  ?Patient can return home with the following A little help with walking and/or transfers;A little help with bathing/dressing/bathroom;Assistance with cooking/housework;Assist for transportation;Help with stairs or ramp for entrance ?  ?Equipment Recommendations ? None recommended by PT  ?  ?Recommendations for Other Services   ? ? ?  ?Precautions / Restrictions Precautions ?Precautions: Knee;Fall ?Precaution Comments: instructed no pillow under  knee ?Restrictions ?Weight Bearing Restrictions: No ?RLE Weight Bearing: Weight bearing as tolerated  ?  ? ?Mobility ? Bed Mobility ?  ?  ?  ?  ?  ?  ?  ?General bed mobility comments: OOB in recliner ?  ? ?Transfers ?Overall transfer level: Needs assistance ?Equipment used: Rolling walker (2 wheels) ?Transfers: Sit to/from Stand ?Sit to Stand: Supervision ?  ?  ?  ?  ?  ?General transfer comment: one VC safety with turns and hand placement with stand to sit ?  ? ?Ambulation/Gait ?Ambulation/Gait assistance: Supervision, Min guard ?Gait Distance (Feet): 55 Feet ?Assistive device: Rolling walker (2 wheels) ?Gait Pattern/deviations: Step-to pattern, Decreased stance time - right, Decreased stride length, Knee flexed in stance - right, Decreased weight shift to right ?Gait velocity: decreased ?  ?  ?General Gait Details: tolerated a functional distance with walker plus Spouse.  Slow and steady.  Pain seemed to "ease off" with increased distance. ? ? ?Stairs ?  ?  ?  ?  ?  ? ? ?Wheelchair Mobility ?Wheelchair Mobility ?Wheelchair mobility:  (has a RAMP) ? ?Modified Rankin (Stroke Patients Only) ?  ? ? ?  ?Balance   ?  ?  ?  ?  ?  ?  ?  ?  ?  ?  ?  ?  ?  ?  ?  ?  ?  ?  ?  ? ?  ?Cognition Arousal/Alertness: Awake/alert ?Behavior During Therapy: [facility] for tasks assessed/performed ?Overall Cognitive Status: Within Functional Limits for tasks assessed ?  ?  ?  ?  ?  ?  ?  ?  ?  ?  ?  ?  ?  ?  ?  ?  ?  General Comments: AxO x 3 very pleasant and eager to D/C to home today ?  ?  ? ?  ?Exercises  15 reps AP ?10 reps knee presses ? ?  ?General Comments   ?  ?  ? ?Pertinent Vitals/Pain Pain Assessment ?Pain Assessment: 0-10 ?Pain Score: 6  ?Pain Location: Rt knee ?Pain Descriptors / Indicators: Aching, Discomfort, Burning ?Pain Intervention(s): Monitored during session, Premedicated before session, Repositioned, Ice applied  ? ? ?Home Living   ?  ?  ?  ?  ?  ?  ?  ?  ?  ?   ?  ?Prior Function    ?  ?  ?   ? ?PT Goals (current goals  can now be found in the care plan section) Progress towards PT goals: Progressing toward goals ? ?  ?Frequency ? ? ? Min 3X/week ? ? ? ?  ?PT Plan Current plan remains appropriate  ? ? ?Co-evaluation   ?  ?  ?  ?  ? ?  ?AM-PAC PT "6 Clicks" Mobility   ?Outcome Measure ? Help needed turning from your back to your side while in a flat bed without using bedrails?: A Little ?Help needed moving from lying on your back to sitting on the side of a flat bed without using bedrails?: A Little ?Help needed moving to and from a bed to a chair (including a wheelchair)?: A Little ?Help needed standing up from a chair using your arms (e.g., wheelchair or bedside chair)?: A Little ?Help needed to walk in hospital room?: A Little ?Help needed climbing 3-5 steps with a railing? : A Little ?6 Click Score: 18 ? ?  ?End of Session Equipment Utilized During Treatment: Gait belt ?Activity Tolerance: Patient tolerated treatment well ?Patient left: in chair;with call bell/phone within reach;with chair alarm set;with SCD's reapplied ?Nurse Communication: Mobility status ?PT Visit Diagnosis: Other abnormalities of gait and mobility (R26.89);Difficulty in walking, not elsewhere classified (R26.2) ?  ? ? ?Time: 1749-4496 ?PT Time Calculation (min) (ACUTE ONLY): 27 min ? ?Charges:  $Gait Training: 8-22 mins ?$Therapeutic Activity: 8-22 mins          ?          ? ?{Romeo Zielinski  PTA ?Acute  Rehabilitation Services ?Pager      [phone] ?Office      [phone] ? ? ?

## 2022-03-07 NOTE — TOC CM/SW Note (Signed)
?  Transition of Care (TOC) Screening Note ? ? ?Patient Details  ?Name: Grant Edwards ?Date of Birth: [birth year] ? ? ?Transition of Care (TOC) CM/SW Contact:    ?Ross Ludwig, LCSW ?Phone Number: ?03/07/2022, 11:08 AM ? ? ? ?Transition of Care Department [facility]) has reviewed patient and no TOC needs have been identified at this time. We will continue to monitor patient advancement through interdisciplinary progression rounds. If new patient transition needs arise, please place a TOC consult. ?  ?

## 2022-03-07 NOTE — Progress Notes (Signed)
Subjective ?Grant Edwards feels a lot better today. He walked yesterday and his pain and swelling are much better today. He has not had any fever, chills or systemic symptoms ? ?Objective: ?Vital signs in last 24 hours: ?Temp:  [98.5 ?F (36.9 ?C)-98.7 ?F (37.1 ?C)] 98.6 ?F (37 ?C) (04/30 0532) ?Pulse Rate:  [66-68] 68 (04/30 0532) ?Resp:  [18] 18 (04/30 0532) ?BP: (119-155)/(57-79) 119/57 (04/30 0532) ?SpO2:  [96 %-99 %] 97 % (04/30 0532) ? ?Intake/Output from previous day: ?04/29 0701 - 04/30 0700 ?In: 1514.6 [P.O.:1200; I.V.:164.5; IV Piggyback:150] ?Out: 2000 [Urine:2000] ?Intake/Output this shift: ?Total I/O ?In: 360 [P.O.:360] ?Out: 400 [Urine:400] ? ?Recent Labs  ?  03/05/22 ?1937  ?HGB 11.8*  ? ?Recent Labs  ?  03/05/22 ?1937  ?WBC 7.9  ?RBC 4.12*  ?HCT 36.4*  ?PLT 286  ? ?Recent Labs  ?  03/05/22 ?1937  ?NA 142  ?K 3.9  ?CL 106  ?CO2 25  ?BUN 14  ?CREATININE 0.90  ?GLUCOSE 106*  ?CALCIUM 9.3  ? ?No results for input(s): LABPT, INR in the last 72 hours. ? ?Neurologically intact ?Neurovascular intact ?Swelling in RLE has decreased since 4/28. Bruising remains. Area of skin erythema blanches with palpation ? ? ? ?Assessment/Plan: ?S/P right TKA with post-op DVT and swelling/bruising from DVT teatment with questionable cellulitis- He appears to be doing better and the appearance of the leg continues to improve. Ready for discharge today. Has follow-up appointment in the office in 2 days ? ? ?Grant Edwards ?03/07/2022, 8:05 AM  ? ? ? ? ?

## 2022-03-07 NOTE — Plan of Care (Signed)
Pt stable at time of d/c. No needs at time of d/c. No change in pt assessment at time of d/c instructions and education.  ?

## 2022-03-08 NOTE — Discharge Summary (Signed)
Patient ID: ?Grant Edwards ?MRN: [id number] ?DOB/AGE: 1940/12/05 81 y.o. ? ?Admit date: 03/05/2022 ?Discharge date: 03/07/2022 ? ?Admission Diagnoses:  ?Principal Problem: ?  Postoperative state ? ? ?Discharge Diagnoses:  ?Same ? ?Past Medical History:  ?Diagnosis Date  ? AAA (abdominal aortic aneurysm) ([place])   ? abdominal and Acending Aortic   ? Arthritis   ? Asthma   ? PRN inhaler  ? Claustrophobia   ? Coronary artery disease   ? Eczema   ? Gout   ? History of kidney stones   ? Hx of adenomatous colonic polyps   ? Hyperlipidemia   ? Hypertension   ? Macular degeneration   ? Pneumonia   ? 81 years old  ? Sleep apnea   ? CPAP  13 setting  ? ? ?Surgeries:  on * No surgery found * ?  ?Consultants: Treatment Team:  ?Gaynelle Arabian, MD ? ?Discharged Condition: Improved ? ?Hospital Course: Grant Edwards is an 81 y.o. male who was admitted 03/05/2022 for operative treatment ofPostoperative state. Patient has severe unremitting pain that affects sleep, daily activities, and work/hobbies. After pre-op clearance the patient was taken to the operating room on * No surgery found * and underwent  .   ? ?Patient was given perioperative antibiotics:  ?Anti-infectives (From admission, onward)  ? ? Start     Dose/Rate Route Frequency Ordered Stop  ? 03/06/22 0930  ceFAZolin (ANCEF) IVPB 1 g/50 mL premix       ? 1 g ?100 mL/hr over 30 Minutes Intravenous Every 8 hours 03/06/22 0845 03/07/22 1136  ? 03/06/22 0015  cefadroxil (DURICEF) capsule 500 mg  Status:  Discontinued       ? 500 mg Oral 2 times daily 03/06/22 0000 03/06/22 0845  ? 03/06/22 0015  doxycycline (VIBRA-TABS) tablet 100 mg  Status:  Discontinued       ? 100 mg Oral 2 times daily 03/06/22 0000 03/07/22 1749  ? ?  ?  ? ?Patient was admitted due to right knee erythema and swelling s/p total knee arthroplasty. Patient was given IV antibiotics and ambulated with therapy. He benefited from a short hospital stay and was discharged to home in stable condition on oral abx.   ? ?Recent vital signs: No data found.  ? ?Recent laboratory studies:  ?Recent Labs  ?  03/05/22 ?1937  ?WBC 7.9  ?HGB 11.8*  ?HCT 36.4*  ?PLT 286  ?NA 142  ?K 3.9  ?CL 106  ?CO2 25  ?BUN 14  ?CREATININE 0.90  ?GLUCOSE 106*  ?CALCIUM 9.3  ? ? ? ?Discharge Medications:   ?Allergies as of 03/07/2022   ? ?   Reactions  ? Aspirin Other (See Comments), Shortness Of Breath  ? asthma  ? Azithromycin Rash  ? Nsaids Shortness Of Breath  ? Renal impact  ? Erythromycin Rash  ? ?  ? ?  ?Medication List  ?  ? ?TAKE these medications   ? ?acetaminophen 650 MG CR tablet ?Commonly known as: TYLENOL ?Take 650 mg by mouth every 6 (six) hours as needed for pain. ?  ?albuterol 108 (90 Base) MCG/ACT inhaler ?Commonly known as: Ventolin HFA ?Inhale 2 puffs into the lungs every 4 (four) hours as needed for wheezing or shortness of breath. ?  ?allopurinol 300 MG tablet ?Commonly known as: ZYLOPRIM ?Take 300 mg by mouth in the morning. ?  ?atorvastatin 20 MG tablet ?Commonly known as: LIPITOR ?Take 20 mg by mouth in the morning. ?  ?Azelastine HCl 137 MCG/SPRAY  Soln ?Place 1 spray into both nostrils daily as needed (congestion). ?  ?cefadroxil 500 MG capsule ?Commonly known as: DURICEF ?Take 1 capsule (500 mg total) by mouth 2 (two) times daily. ?  ?clobetasol cream 0.05 % ?Commonly known as: TEMOVATE ?Apply 1 application. topically daily as needed (break outs). ?  ?colchicine 0.6 MG tablet ?Take 0.6-1.2 mg by mouth daily as needed (gout flares.). ?  ?doxycycline 100 MG capsule ?Commonly known as: VIBRAMYCIN ?Take 100 mg by mouth 2 (two) times daily. ?  ?ezetimibe 10 MG tablet ?Commonly known as: ZETIA ?Take 10 mg by mouth in the morning. ?  ?fluticasone 50 MCG/ACT nasal spray ?Commonly known as: FLONASE ?Place 1 spray into both nostrils at bedtime. ?  ?furosemide 20 MG tablet ?Commonly known as: LASIX ?Take 20 mg by mouth in the morning. ?  ?lisinopril 40 MG tablet ?Commonly known as: ZESTRIL ?Take 40 mg by mouth in the morning. ?   ?methocarbamol 500 MG tablet ?Commonly known as: ROBAXIN ?Take 1 tablet (500 mg total) by mouth every 6 (six) hours as needed for muscle spasms. ?  ?metoprolol succinate 25 MG 24 hr tablet ?Commonly known as: TOPROL-XL ?Take 25 mg by mouth in the morning. ?  ?oxyCODONE 5 MG immediate release tablet ?Commonly known as: Oxy IR/ROXICODONE ?Take 1-2 tablets (5-10 mg total) by mouth every 6 (six) hours as needed for severe pain. ?What changed: how much to take ?  ?PRESERVISION AREDS 2 PO ?Take 1 tablet by mouth in the morning and at bedtime. ?  ?REFRESH OP ?Place 1 drop into both eyes daily as needed (dry eyes). ?  ?Repatha SureClick 725 MG/ML Soaj ?Generic drug: Evolocumab ?Inject 140 mg into the skin every 21 ( twenty-one) days. ?  ?Rivaroxaban Stater Pack (15 mg and 20 mg) ?Commonly known as: XARELTO STARTER PACK ?Follow package directions: Take one '15mg'$  tablet by mouth twice a day. On day 22, switch to one '20mg'$  tablet once a day. Take with food. ?  ?traMADol 50 MG tablet ?Commonly known as: ULTRAM ?Take 1-2 tablets (50-100 mg total) by mouth every 6 (six) hours as needed for moderate pain. ?What changed: how much to take ?  ?triamcinolone cream 0.1 % ?Commonly known as: KENALOG ?Apply 1 application. topically 2 (two) times daily as needed (break outs). ?  ?Vitamin D3 250 MCG (10000 UT) capsule ?Take 10,000 Units by mouth every 3 (three) days. ?  ? ?  ? ? ?Diagnostic Studies: DG Knee 2 Views Right ? ?Result Date: 02/28/2022 ?CLINICAL DATA:  Knee pain status post total knee replacement 6 days ago. EXAM: RIGHT KNEE - 1-2 VIEW COMPARISON:  None. FINDINGS: Status post total right knee arthroplasty. No perihardware lucency is seen to indicate hardware failure or loosening. Moderate joint effusion. No acute fracture or dislocation. Moderate vascular calcifications. IMPRESSION: Status post total right knee arthroplasty without evidence of hardware failure. Electronically Signed   By: Yvonne Kendall M.D.   On: 02/28/2022 11:41   ? ?DG Chest Port 1 View ? ?Result Date: 03/05/2022 ?CLINICAL DATA:  Fever. EXAM: PORTABLE CHEST 1 VIEW COMPARISON:  August 14, 2021 FINDINGS: The heart size and mediastinal contours are within normal limits. Very mild atelectasis is seen within the bilateral lung bases. There is no evidence of a pleural effusion or pneumothorax. Multilevel degenerative changes seen throughout the thoracic spine. IMPRESSION: Very mild bibasilar atelectasis. Electronically Signed   By: Virgina Norfolk M.D.   On: 03/05/2022 20:56  ? ?DG Knee Complete 4 Views Right ? ?  Result Date: 03/05/2022 ?CLINICAL DATA:  Status post total knee replacement. EXAM: RIGHT KNEE - COMPLETE 4+ VIEW COMPARISON:  February 28, 2022 FINDINGS: A right knee replacement is seen without evidence of surrounding lucency to suggest the presence of hardware loosening or infection. No evidence of an acute fracture or dislocation. A large joint effusion is seen with marked severity diffuse soft tissue swelling. This is most prominent along the anterior and medial aspects of the right knee and is increased in severity when compared to the prior study. Marked severity vascular calcification is also noted. IMPRESSION: 1. Right knee replacement without evidence of hardware loosening or infection. 2. Large joint effusion with marked severity diffuse soft tissue swelling, increased in severity when compared to the prior study. Electronically Signed   By: Virgina Norfolk M.D.   On: 03/05/2022 20:58  ? ?VAS [place] LOWER EXTREMITY VENOUS (DVT) (7a-7p) ? ?Result Date: 02/28/2022 ? Lower Venous DVT Study Patient Name:  Grant Edwards  Date of Exam:   02/28/2022 Medical Rec #: [id number]        Accession #:    [id number] Date of Birth: 1941-01-12        Patient Gender: M Patient Age:   66 years Exam Location:  [facility] Procedure:      VAS [place] LOWER EXTREMITY VENOUS (DVT) Referring Phys: Domenic Moras --------------------------------------------------------------------------------   Indications: S/p right knee replacement x6 days, right lower extremity edema x4 days.  Limitations: Poor ultrasound/tissue interface. Comparison Study: No prior study Performing Technologist: Maudry Mayhew

## 2022-03-10 LAB — CULTURE, BLOOD (ROUTINE X 2)
Culture: NO GROWTH
Culture: NO GROWTH
Special Requests: ADEQUATE
Special Requests: ADEQUATE

## 2022-06-02 IMAGING — DX DG KNEE COMPLETE 4+V*R*
4 series · 4 of 4 positions shown · non-contrast
Comparison: February 28, 2022

CLINICAL DATA: Status post total knee replacement.

EXAM:
RIGHT KNEE - COMPLETE 4+ VIEW

[knee ap]
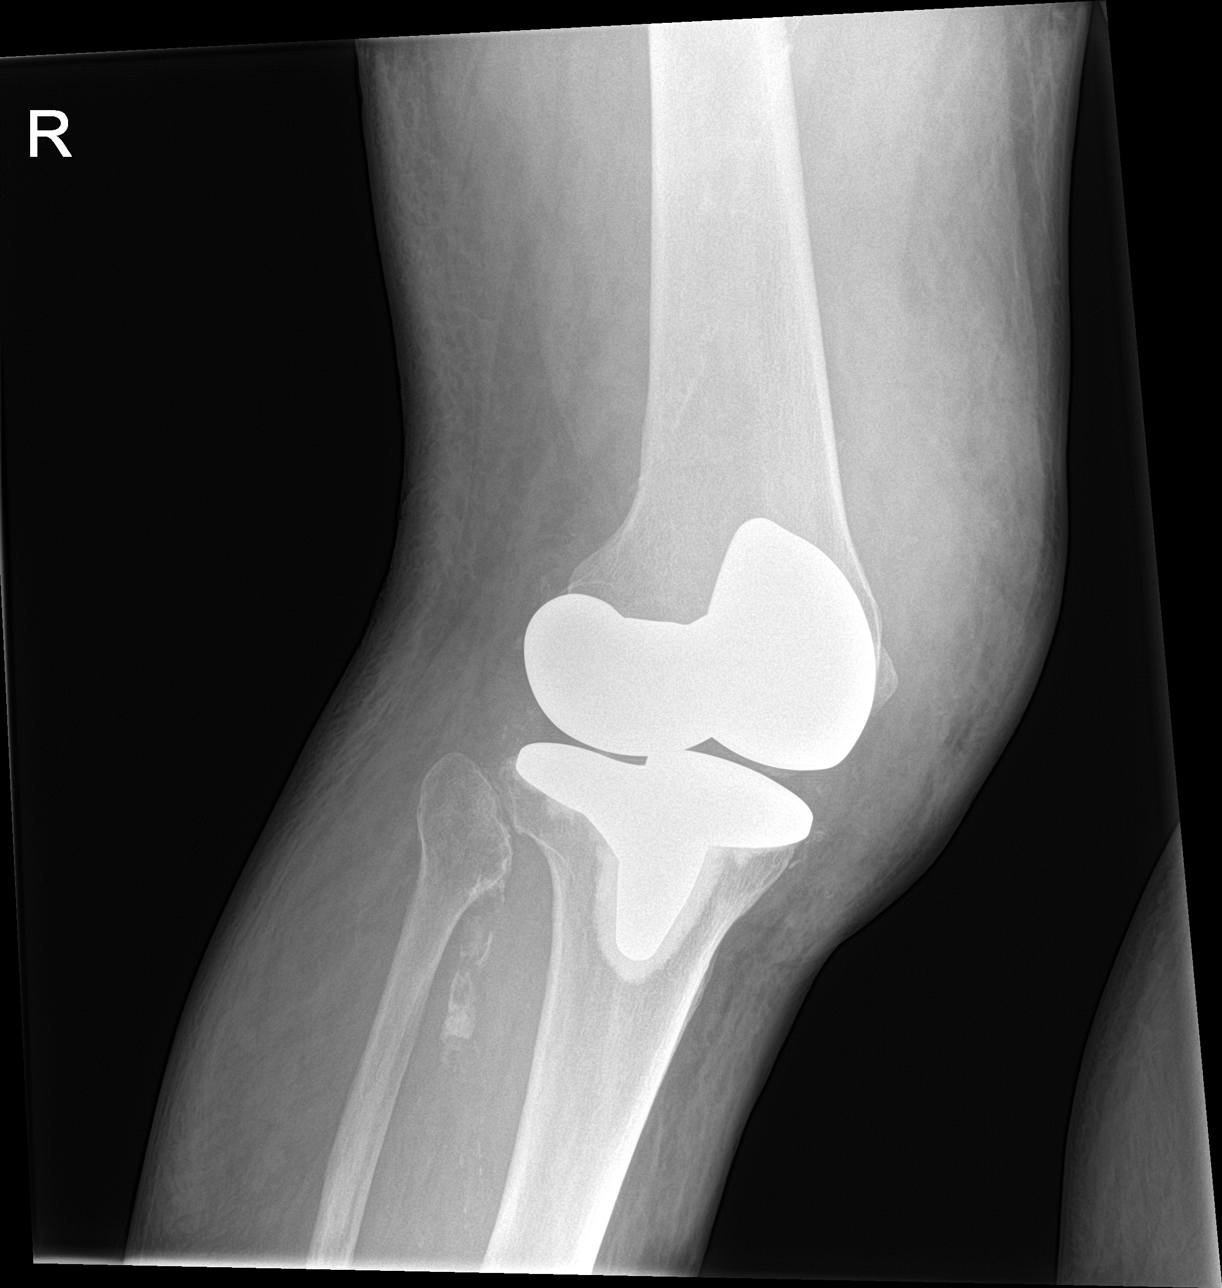

[knee lat]
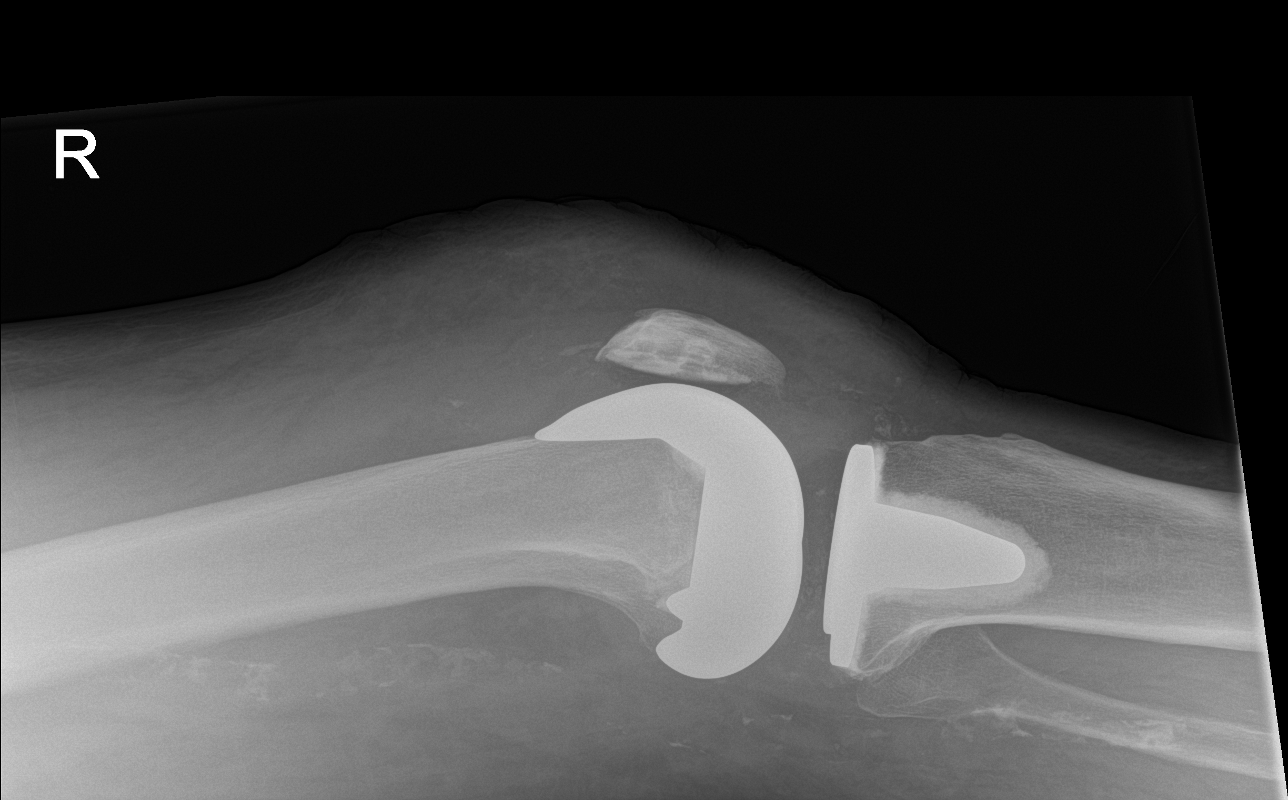

[knee obl (1 of 2)]
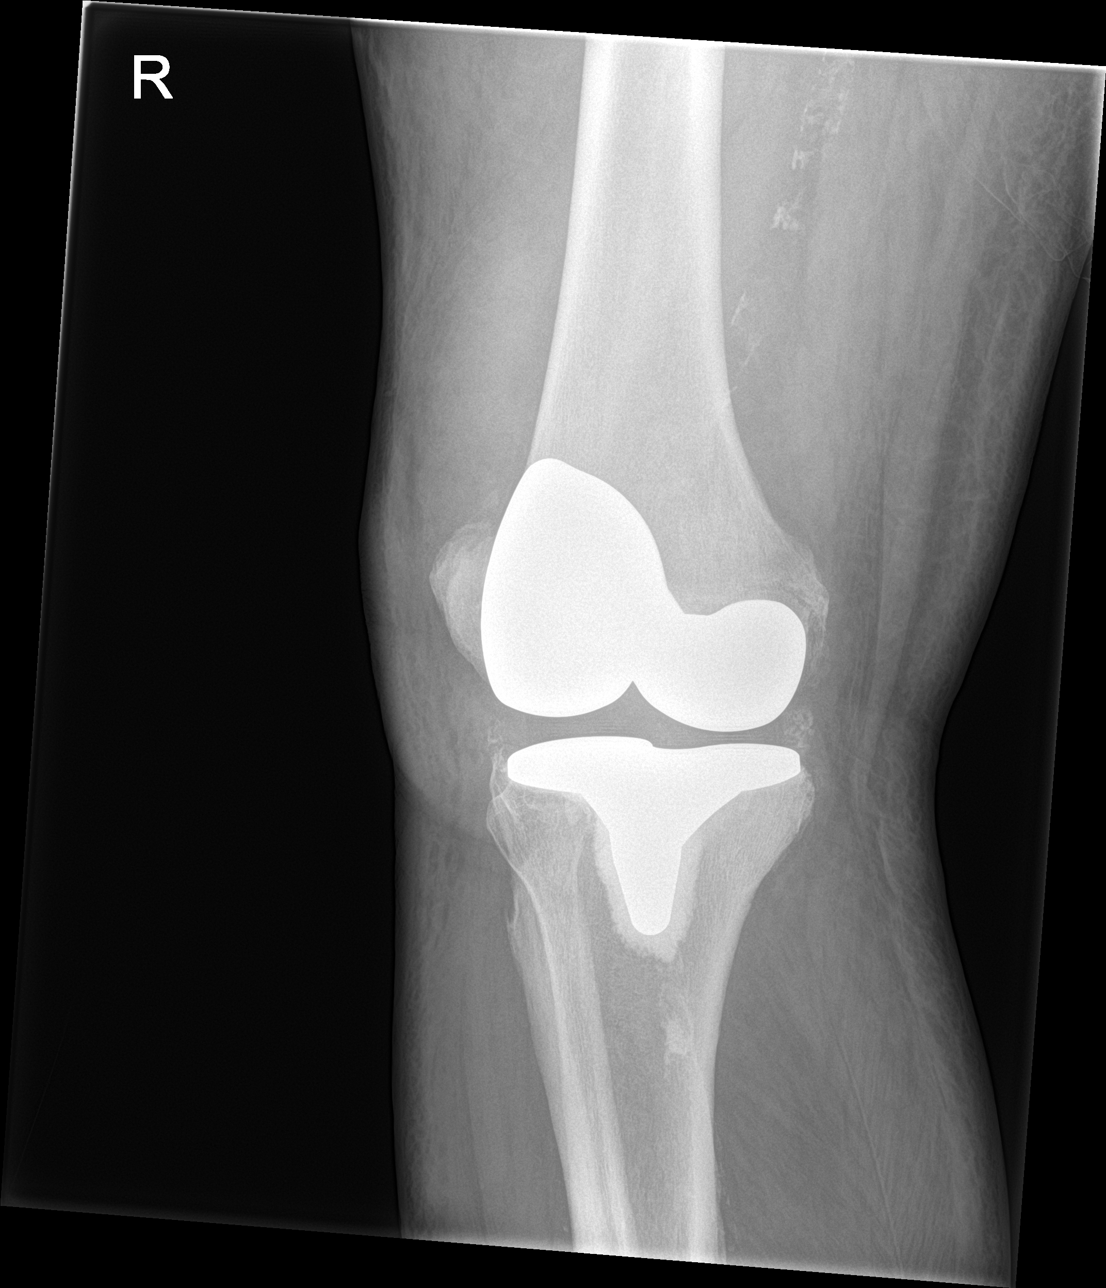

[knee obl (2 of 2)]
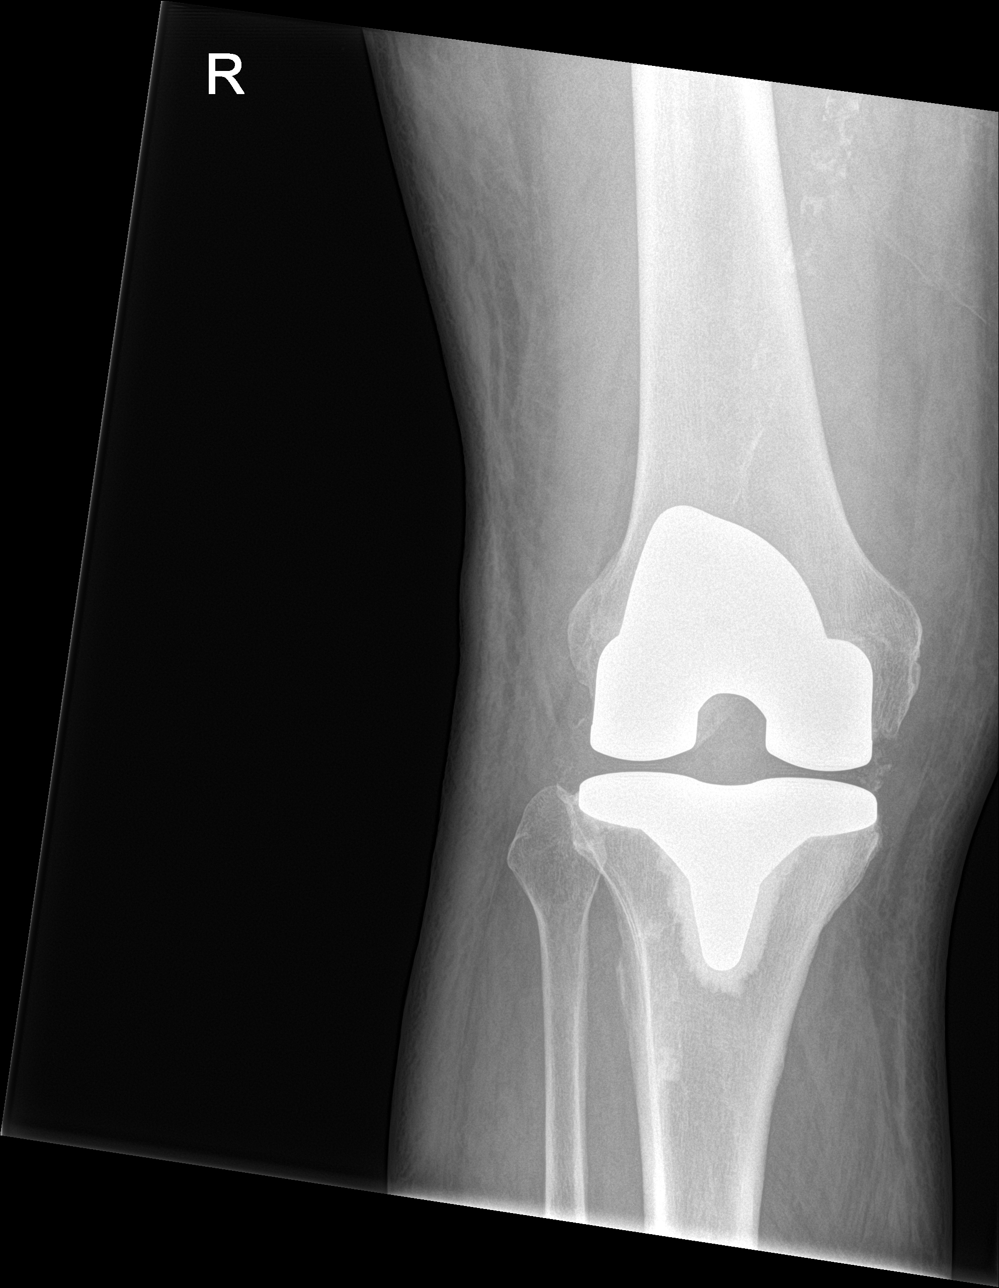

[4 of 4 positions shown; findings below may reference images not displayed]

FINDINGS: A right knee replacement is seen without evidence of surrounding
lucency to suggest the presence of hardware loosening or infection.
No evidence of an acute fracture or dislocation. A large joint
effusion is seen with marked severity diffuse soft tissue swelling.
This is most prominent along the anterior and medial aspects of the
right knee and is increased in severity when compared to the prior
study. Marked severity vascular calcification is also noted.
IMPRESSION: 1. Right knee replacement without evidence of hardware loosening or
infection.
2. Large joint effusion with marked severity diffuse soft tissue
swelling, increased in severity when compared to the prior study.

## 2023-06-16 ENCOUNTER — Telehealth: Payer: Self-pay | Admitting: Nurse Practitioner

## 2023-06-16 ENCOUNTER — Other Ambulatory Visit: Payer: Self-pay | Admitting: Nurse Practitioner

## 2023-06-16 ENCOUNTER — Ambulatory Visit (INDEPENDENT_AMBULATORY_CARE_PROVIDER_SITE_OTHER): Payer: Medicare Other | Admitting: Nurse Practitioner

## 2023-06-16 VITALS — BP 118/76 | HR 67 | Temp 98.0°F | Ht 71.5 in | Wt 234.5 lb

## 2023-06-16 DIAGNOSIS — I7121 Aneurysm of the ascending aorta, without rupture: Secondary | ICD-10-CM | POA: Diagnosis not present

## 2023-06-16 DIAGNOSIS — M1711 Unilateral primary osteoarthritis, right knee: Secondary | ICD-10-CM

## 2023-06-16 DIAGNOSIS — I714 Abdominal aortic aneurysm, without rupture, unspecified: Secondary | ICD-10-CM

## 2023-06-16 DIAGNOSIS — M109 Gout, unspecified: Secondary | ICD-10-CM | POA: Diagnosis not present

## 2023-06-16 DIAGNOSIS — F40240 Claustrophobia: Secondary | ICD-10-CM

## 2023-06-16 DIAGNOSIS — I700 Atherosclerosis of aorta: Secondary | ICD-10-CM | POA: Insufficient documentation

## 2023-06-16 MED ORDER — DIAZEPAM 2 MG PO TABS: 2.0000 mg | ORAL_TABLET | Freq: Once | ORAL | 0 refills | Status: AC

## 2023-06-16 NOTE — Assessment & Plan Note (Signed)
Chronic Referral to cardiothoracic surgeon made today for monitoring recommendations. Blood pressure well-controlled currently.

## 2023-06-16 NOTE — Addendum Note (Signed)
Addended by: Elenore Paddy on: 06/16/2023 03:14 PM   Modules accepted: Orders

## 2023-06-16 NOTE — Progress Notes (Signed)
New Patient Office Visit  Subjective    Patient ID: Grant Edwards, male    DOB: 03-24-1941  Age: 83 y.o. MRN: [id number]  CC:  Chief Complaint  Patient presents with   aneurysym     HPI Grant Edwards presents to establish care He is transferring offices because his previous primary care provider left his previous on this.  Additionally his previous cardiologist has reduced their hours and he would like to be referred to a new cardiologist due to ease of scheduling.  He reports he has a history of an ascending aortic thoracic aneurysm as well as abdominal aortic aneurysm.  Per chart review I do see CT scan from July 2020 indicating ascending thoracic aortic aneurysm of 3.9 cm diameter, in 2016 he had ultrasound which did identify a abdominal aortic aneurysm with diameter of approximately 3 cm.  He denies any chest pain, shortness of breath, worsening exertional fatigue.  She also has severe osteoarthritis and underwent right knee replacement approximately 1 year ago.  Unfortunately he did have complications including infection, DVT, and reports that there was evidence of possible fracture of the right knee last time he was evaluated a few months ago by orthopedics.  Due to this he has severe pain that limits his mobility.  He uses a cane for ambulation most of the time, he is requesting handicap placard.  He also has hyperlipidemia with coronary artery disease and aortic atherosclerosis.  Last lipid panel was collected approximately 4 months ago which identified an LDL of 46.  He is currently on atorvastatin and Zetia.  He has gout for which he takes allopurinol for prevention and has colchicine for an acute flare.  Last uric acid level was checked about 4 months ago and it was 3.6.  Outpatient Encounter Medications as of 06/16/2023  Medication Sig   albuterol (VENTOLIN HFA) 108 (90 Base) MCG/ACT inhaler Inhale 2 puffs into the lungs every 4 (four) hours as needed for wheezing or  shortness of breath.   allopurinol (ZYLOPRIM) 300 MG tablet Take 300 mg by mouth in the morning.   atorvastatin (LIPITOR) 20 MG tablet Take 20 mg by mouth in the morning.   colchicine 0.6 MG tablet Take 0.6-1.2 mg by mouth daily as needed (gout flares.).   ezetimibe (ZETIA) 10 MG tablet Take 10 mg by mouth in the morning.   Faricimab-svoa (VABYSMO IZ) by Intravitreal route.   fluticasone (FLONASE) 50 MCG/ACT nasal spray Place 1 spray into both nostrils at bedtime.   furosemide (LASIX) 20 MG tablet Take 20 mg by mouth in the morning.   lisinopril (PRINIVIL,ZESTRIL) 40 MG tablet Take 40 mg by mouth in the morning.   metoprolol succinate (TOPROL-XL) 25 MG 24 hr tablet Take 25 mg by mouth in the morning.   vitamin E 180 MG (400 UNITS) capsule Take 400 Units by mouth daily.   acetaminophen (TYLENOL) 650 MG CR tablet Take 650 mg by mouth every 6 (six) hours as needed for pain. (Patient not taking: Reported on 06/16/2023)   Azelastine HCl 137 MCG/SPRAY SOLN Place 1 spray into both nostrils daily as needed (congestion).   Cholecalciferol (VITAMIN D3) 250 MCG (10000 UT) capsule Take 10,000 Units by mouth every 3 (three) days.   clobetasol cream (TEMOVATE) 0.05 % Apply 1 application. topically daily as needed (break outs).   [DISCONTINUED] cefadroxil (DURICEF) 500 MG capsule Take 1 capsule (500 mg total) by mouth 2 (two) times daily.   [DISCONTINUED] doxycycline (VIBRAMYCIN) 100 MG capsule Take 100  mg by mouth 2 (two) times daily. (Patient not taking: Reported on 06/16/2023)   [DISCONTINUED] Evolocumab (REPATHA SURECLICK) 140 MG/ML SOAJ Inject 140 mg into the skin every 21 ( twenty-one) days. (Patient not taking: Reported on 06/16/2023)   [DISCONTINUED] methocarbamol (ROBAXIN) 500 MG tablet Take 1 tablet (500 mg total) by mouth every 6 (six) hours as needed for muscle spasms. (Patient not taking: Reported on 06/16/2023)   [DISCONTINUED] Multiple Vitamins-Minerals (PRESERVISION AREDS 2 PO) Take 1 tablet by mouth in  the morning and at bedtime. (Patient not taking: Reported on 06/16/2023)   [DISCONTINUED] oxyCODONE (OXY IR/ROXICODONE) 5 MG immediate release tablet Take 1-2 tablets (5-10 mg total) by mouth every 6 (six) hours as needed for severe pain. (Patient not taking: Reported on 06/16/2023)   [DISCONTINUED] Polyvinyl Alcohol-Povidone (REFRESH OP) Place 1 drop into both eyes daily as needed (dry eyes).   [DISCONTINUED] RIVAROXABAN (XARELTO) VTE STARTER PACK (15 & 20 MG) Follow package directions: Take one 15mg  tablet by mouth twice a day. On day 22, switch to one 20mg  tablet once a day. Take with food.   [DISCONTINUED] traMADol (ULTRAM) 50 MG tablet Take 1-2 tablets (50-100 mg total) by mouth every 6 (six) hours as needed for moderate pain. (Patient not taking: Reported on 06/16/2023)   [DISCONTINUED] triamcinolone cream (KENALOG) 0.1 % Apply 1 application. topically 2 (two) times daily as needed (break outs). (Patient not taking: Reported on 03/05/2022)   No facility-administered encounter medications on file as of 06/16/2023.    Past Medical History:  Diagnosis Date   AAA (abdominal aortic aneurysm) (HCC)    abdominal and Acending Aortic    Arthritis    Asthma    PRN inhaler   Claustrophobia    Coronary artery disease    Eczema    Gout    History of kidney stones    Hx of adenomatous colonic polyps    Hyperlipidemia    Hypertension    Macular degeneration    Pneumonia    82 years old   Sleep apnea    CPAP  13 setting    Past Surgical History:  Procedure Laterality Date   COLONOSCOPY W/ POLYPECTOMY  last 2015   multiple   knee intercompartmental replacement  2008   Dr. Thurston Hole    POLYPECTOMY     RETINAL LASER PROCEDURE  2010   RHINOPLASTY  1976   TONSILLECTOMY     TOTAL KNEE ARTHROPLASTY Right 02/22/2022   Procedure: TOTAL KNEE ARTHROPLASTY;  Surgeon: Ollen Gross, MD;  Location: [facility];  Service: Orthopedics;  Laterality: Right;    Family History  Problem Relation Age of Onset    Cancer Mother    Allergic rhinitis Mother    Breast cancer Mother    Heart disease Father    Heart attack Father    Allergic rhinitis Son    Angioedema Neg Hx    Asthma Neg Hx    Eczema Neg Hx    Immunodeficiency Neg Hx    Urticaria Neg Hx    Colon cancer Neg Hx    Colon polyps Neg Hx    Esophageal cancer Neg Hx    Rectal cancer Neg Hx    Stomach cancer Neg Hx     Social History   Socioeconomic History   Marital status: Married    Spouse name: Not on file   Number of children: Not on file   Years of education: Not on file   Highest education level: Not on file  Occupational  History   Not on file  Tobacco Use   Smoking status: Never   Smokeless tobacco: Never  Vaping Use   Vaping status: Never Used  Substance and Sexual Activity   Alcohol use: No    Alcohol/week: 0.0 standard drinks of alcohol   Drug use: No   Sexual activity: Not Currently  Other Topics Concern   Not on file  Social History Narrative   Not on file   Social Determinants of Health   Financial Resource Strain: Low Risk  (11/17/2022)   Received from [facility], [facility]   Overall Financial Resource Strain (CARDIA)    Difficulty of Paying Living Expenses: Not hard at all  Food Insecurity: No Food Insecurity (11/17/2022)   Received from [facility], [facility]   Hunger Vital Sign    Worried About Running Out of Food in the Last Year: Never true    Ran Out of Food in the Last Year: Never true  Transportation Needs: No Transportation Needs (11/17/2022)   Received from [employer], [facility]   PRAPARE - Transportation    Lack of Transportation (Medical): No    Lack of Transportation (Non-Medical): No  Physical Activity: Not on file  Stress: No Stress Concern Present (06/30/2021)   Received from [facility], [facility] of Occupational Health - Occupational Stress Questionnaire    Feeling of Stress : Not at all  Social Connections: Unknown (03/08/2022)    Received from [facility], [facility]   Social Network    Social Network: Not on file  Intimate Partner Violence: Unknown (02/12/2022)   Received from [facility], [facility]   HITS    Physically Hurt: Not on file    Insult or Talk Down To: Not on file    Threaten Physical Harm: Not on file    Scream or Curse: Not on file    ROS: see HPI      Objective    BP 118/76   Pulse 67   Temp 98 F (36.7 C) (Temporal)   Ht 5' 11.5" (1.816 m)   Wt 234 lb 8 oz (106.4 kg)   SpO2 98%   BMI 32.25 kg/m   Physical Exam Vitals reviewed.  Constitutional:      Appearance: Normal appearance.  HENT:     Head: Normocephalic and atraumatic.  Cardiovascular:     Rate and Rhythm: Normal rate and regular rhythm.  Pulmonary:     Effort: Pulmonary effort is normal.     Breath sounds: Normal breath sounds.  Musculoskeletal:     Cervical back: Neck supple.     Right knee: Deformity present. No swelling.  Skin:    General: Skin is warm and dry.  Neurological:     Mental Status: He is alert and oriented to person, place, and time.     Gait: Gait abnormal (antalgic gait).  Psychiatric:        Mood and Affect: Mood normal.        Behavior: Behavior normal.        Thought Content: Thought content normal.        Judgment: Judgment normal.         Assessment & Plan:   Problem List Items Addressed This Visit       Cardiovascular and Mediastinum   AAA (abdominal aortic aneurysm) without rupture (HCC) - Primary    Chronic Referral to cardiothoracic surgeon made today for monitoring recommendations. Blood pressure well-controlled currently.  Relevant Orders   Ambulatory referral to Cardiothoracic Surgery   Aneurysm of ascending aorta without rupture [facility])    Chronic Referral to cardiothoracic surgeon made today for monitoring recommendations. Blood pressure well-controlled currently.      Relevant Orders   Ambulatory referral to Cardiothoracic Surgery   Aortic  atherosclerosis (HCC)    Chronic LDL at goal Continue atorvastatin 20 mg daily and Zetia 10 mg daily        Musculoskeletal and Integument   Osteoarthritis of right knee    Chronic, pain is intermittent Recommend he continue to use topical treatments and assistive devices. Follow-up with orthopedics as scheduled. Handicap placard seems reasonable, paperwork provided to patient.        Other   Gout    Chronic, stable, seems well-controlled Continue allopurinol 300 mg daily and colchicine as needed       Return in about 3 months (around 09/16/2023) for F/U with Maralyn Sago.   Elenore Paddy, NP

## 2023-06-16 NOTE — Assessment & Plan Note (Signed)
Chronic LDL at goal Continue atorvastatin 20 mg daily and Zetia 10 mg daily

## 2023-06-16 NOTE — Telephone Encounter (Signed)
Please call patient and let him know that the cardiothoracic surgery office has already gotten back to me regarding the referral.  They need an updated CT scan of his chest before they are willing to schedule him an appointment for consultation.  I have ordered a CT scan of chest as well as ultrasound of abdomen for evaluation of his aneurysms.    I will also send in a one-time dose of Valium that he can take 30-60 minutes prior to the CT scan to help with his claustrophobia. Please verify that he does not have an allergy to Valium. Additionally, He should be told to not drive or operate heavy machinery once taking the medication so he will need someone to drive him to and from the CT scan appointment.    Once I get the results I will rerefer him to the cardiothoracic surgery office.  Please let me know if he has any questions.

## 2023-06-16 NOTE — Assessment & Plan Note (Signed)
Chronic, stable, seems well-controlled Continue allopurinol 300 mg daily and colchicine as needed

## 2023-06-16 NOTE — Assessment & Plan Note (Signed)
Chronic, pain is intermittent Recommend he continue to use topical treatments and assistive devices. Follow-up with orthopedics as scheduled. Handicap placard seems reasonable, paperwork provided to patient.

## 2023-06-20 NOTE — Telephone Encounter (Signed)
Pt is schedule for CT on 07/04/23 but not yet for the US Abdomen, left voice mail to call back in regards to the ultrasound

## 2023-06-20 NOTE — Telephone Encounter (Signed)
Please call patient back

## 2023-06-21 NOTE — Telephone Encounter (Signed)
Patient called back and said he would like clarification of the message Delorise Shiner left. Patient would like a call back at [phone].

## 2023-06-21 NOTE — Telephone Encounter (Signed)
Called pt back inform him of the CT on 8/26, but the abdomen U/S still pending. [facility] will call him to set that appt up../l,mb

## 2023-06-28 ENCOUNTER — Other Ambulatory Visit: Payer: Self-pay | Admitting: Nurse Practitioner

## 2023-06-28 ENCOUNTER — Ambulatory Visit
Admission: RE | Admit: 2023-06-28 | Discharge: 2023-06-28 | Disposition: A | Discharge status: Home / Self Care | Payer: Medicare Other | Source: Ambulatory Visit | Attending: Nurse Practitioner | Admitting: Nurse Practitioner

## 2023-06-28 DIAGNOSIS — I7121 Aneurysm of the ascending aorta, without rupture: Secondary | ICD-10-CM

## 2023-06-28 DIAGNOSIS — I714 Abdominal aortic aneurysm, without rupture, unspecified: Secondary | ICD-10-CM

## 2023-06-28 DIAGNOSIS — F40240 Claustrophobia: Secondary | ICD-10-CM

## 2023-07-04 ENCOUNTER — Ambulatory Visit: Admission: RE | Admit: 2023-07-04 | Payer: Medicare Other | Source: Ambulatory Visit

## 2023-07-04 DIAGNOSIS — I714 Abdominal aortic aneurysm, without rupture, unspecified: Secondary | ICD-10-CM

## 2023-07-04 DIAGNOSIS — I7121 Aneurysm of the ascending aorta, without rupture: Secondary | ICD-10-CM

## 2023-07-04 MED ORDER — IOPAMIDOL (ISOVUE-370) INJECTION 76%
200.0000 mL | Freq: Once | INTRAVENOUS | Status: AC | PRN
Start: 2023-07-04 — End: ?

## 2023-07-04 MED ORDER — IOPAMIDOL (ISOVUE-370) INJECTION 76%
200.0000 mL | Freq: Once | INTRAVENOUS | Status: AC | PRN
  Administered 2023-07-04: 75 mL via INTRAVENOUS

## 2023-07-06 ENCOUNTER — Other Ambulatory Visit: Payer: Self-pay | Admitting: Nurse Practitioner

## 2023-07-06 DIAGNOSIS — E790 Hyperuricemia without signs of inflammatory arthritis and tophaceous disease: Secondary | ICD-10-CM

## 2023-07-06 DIAGNOSIS — E785 Hyperlipidemia, unspecified: Secondary | ICD-10-CM

## 2023-07-07 ENCOUNTER — Encounter: Payer: Self-pay | Admitting: Nurse Practitioner

## 2023-07-07 ENCOUNTER — Other Ambulatory Visit: Payer: Self-pay | Admitting: Nurse Practitioner

## 2023-07-07 DIAGNOSIS — I7121 Aneurysm of the ascending aorta, without rupture: Secondary | ICD-10-CM

## 2023-07-13 ENCOUNTER — Institutional Professional Consult (permissible substitution) (INDEPENDENT_AMBULATORY_CARE_PROVIDER_SITE_OTHER): Payer: Medicare Other | Admitting: Surgical

## 2023-07-13 VITALS — BP 150/80 | HR 61 | Resp 18 | Ht 71.0 in | Wt 235.0 lb

## 2023-07-13 DIAGNOSIS — I7121 Aneurysm of the ascending aorta, without rupture: Secondary | ICD-10-CM

## 2023-07-13 NOTE — Patient Instructions (Signed)
Good control of blood pressure and healthy lifestyle measures.

## 2023-07-13 NOTE — Progress Notes (Signed)
Subjective:     Patient ID: Grant Edwards, male    DOB: 1941-02-22, 82 y.o.   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  It was first discovered in 2020 measuring  approximately 3.9 cm in diameter.  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  A repeat CTA of the chest done in 07/04/2023 is as described below and the thoracic aorta is essentially minimally changed measuring 4.0 centimeters.  Cardiac risk factors include hyperlipidemia and hypertension.  He also has a history of sleep apnea on CPAP.  Review of Systems  Constitutional: Negative.   HENT: Negative.    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  Negative for chest pain.       History of DVT after knee surgery  Gastrointestinal: Negative.   Genitourinary: Negative.   Musculoskeletal:        Chronic bilateral knee pain  Skin: Negative.   Neurological: Negative.   Psychiatric/Behavioral: Negative.          Past Medical History:  Diagnosis Date   AAA (abdominal aortic aneurysm) (HCC)    abdominal and Acending Aortic    Arthritis    Asthma    PRN inhaler   Claustrophobia    Coronary artery disease    Eczema    Gout    History of kidney stones    Hx of adenomatous colonic polyps    Hyperlipidemia    Hypertension    Macular degeneration    Pneumonia    82 years old   Sleep apnea    CPAP  13 setting    Past Surgical History:  Procedure Laterality Date   COLONOSCOPY W/ POLYPECTOMY  last 2015   multiple   knee intercompartmental replacement  2008   Dr. Thurston Hole    POLYPECTOMY     RETINAL LASER PROCEDURE  2010   RHINOPLASTY  1976    TONSILLECTOMY     TOTAL KNEE ARTHROPLASTY Right 02/22/2022   Procedure: TOTAL KNEE ARTHROPLASTY;  Surgeon: Ollen Gross, MD;  Location: [facility];  Service: Orthopedics;  Laterality: Right;     Patient Active Problem List   Diagnosis Date Noted   Aneurysm of ascending aorta without rupture (HCC) 06/16/2023   Gout 06/16/2023   Aortic atherosclerosis (HCC) 06/16/2023   Postoperative state 03/06/2022   OA (osteoarthritis) of knee 02/22/2022   Osteoarthritis of right knee 02/22/2022   AAA (abdominal aortic aneurysm) without rupture (HCC) 12/19/2018   Anaphylactic shock due to adverse food reaction 08/29/2017   Dermographia 08/29/2017   Essential hypertension 08/29/2017   Obstructive sleep apnea treated with continuous positive airway pressure (CPAP) 08/29/2017   Other allergic rhinitis 08/29/2017   Mild persistent asthma without complication 08/29/2017     Allergies  Allergen Reactions   Aspirin Other (See Comments) and Shortness Of Breath    asthma   Azithromycin Rash   Nsaids Shortness Of Breath    Renal impact   Erythromycin Rash      Current Outpatient Medications  Medication Instructions   acetaminophen (TYLENOL)  650 mg, Oral, Every 6 hours PRN   albuterol (VENTOLIN HFA) 108 (90 Base) MCG/ACT inhaler 2 puffs, Inhalation, Every 4 hours PRN   allopurinol (ZYLOPRIM) 300 MG tablet TAKE 1 TABLET BY MOUTH EVERY DAY IN THE MORNING   atorvastatin (LIPITOR) 20 mg, Oral, Every morning   Azelastine HCl 137 MCG/SPRAY SOLN 1 spray, Each Nare, Daily PRN   clobetasol cream (TEMOVATE) 0.05 % 1 application , Topical, Daily PRN   colchicine 0.6-1.2 mg, Oral, Daily PRN   ezetimibe (ZETIA) 10 mg, Oral, Daily   Faricimab-svoa (VABYSMO IZ) Intravitreal   fluticasone (FLONASE) 50 MCG/ACT nasal spray 1 spray, Each Nare, Daily at bedtime   furosemide (LASIX) 20 mg, Oral, Every morning   lisinopril (ZESTRIL) 40 mg, Oral, Every morning   metoprolol succinate (TOPROL-XL) 25 mg, Oral, Every morning    Vitamin D3 10,000 Units, Oral, every 72 hours   vitamin E 400 Units, Oral, Daily     Social History   Occupational History   Not on file  Tobacco Use   Smoking status: Never   Smokeless tobacco: Never  Vaping Use   Vaping status: Never Used  Substance and Sexual Activity   Alcohol use: No    Alcohol/week: 0.0 standard drinks of alcohol   Drug use: No   Sexual activity: Not Currently     Objective:       07/13/2023    2:04 PM 06/16/2023   10:50 AM 03/07/2022    5:32 AM  Vitals with BMI  Height 5\' 11"  5' 11.5"   Weight 235 lbs 234 lbs 8 oz   BMI 32.79 32.25   Systolic 150 118 130  Diastolic 80 76 57  Pulse 61 67 68        Physical Exam Constitutional:      Appearance: Normal appearance. He is obese. He is not ill-appearing.  HENT:     Head: Normocephalic and atraumatic.  Eyes:     General: No scleral icterus.    Pupils: Pupils are equal, round, and reactive to light.  Neck:     Vascular: No carotid bruit.  Cardiovascular:     Rate and Rhythm: Normal rate and regular rhythm.     Heart sounds: Normal heart sounds.  Pulmonary:     Breath sounds: Normal breath sounds.  Abdominal:     General: Bowel sounds are normal.     Palpations: Abdomen is soft.     Tenderness: There is no abdominal tenderness.  Musculoskeletal:     Cervical back: Neck supple.     Right lower leg: No edema.     Left lower leg: No edema.  Skin:    General: Skin is warm and dry.  Neurological:     General: No focal deficit present.     Mental Status: He is alert.     No results found for any visits on 07/13/23. Narrative & Impression  CLINICAL DATA:  82 year old male with a history aortic aneurysm   EXAM: CT ANGIOGRAPHY CHEST WITH CONTRAST   TECHNIQUE: Multidetector CT imaging of the chest was performed using the standard protocol during bolus administration of intravenous contrast. Multiplanar CT image reconstructions and MIPs were obtained to evaluate the vascular anatomy.    RADIATION DOSE REDUCTION: This exam was performed according to the departmental dose-optimization program which includes automated exposure control, adjustment of the mA and/or kV according to patient size and/or use of iterative reconstruction technique.   CONTRAST:  75mL ISOVUE-370 IOPAMIDOL (ISOVUE-370) INJECTION 76%, <See  Chart> ISOVUE-370 IOPAMIDOL (ISOVUE-370) INJECTION 76%   COMPARISON:  05/31/2019   FINDINGS: Cardiovascular:   Heart:   No cardiomegaly. No pericardial fluid/thickening. Calcifications of left main, left anterior descending, circumflex, right coronary arteries.   Aorta:   No significant aortic valve calcifications.   Greatest estimated diameter of the aortic annulus 27 mm on the coronal reformatted images.   Greatest estimated diameter of the sino-tubular junction, 30 mm on the coronal images.   Greatest estimated diameter of the ascending aorta on the axial images, 40 mm.   Atherosclerotic changes of the aorta. Branch vessels are patent with a 3 vessel arch. Cervical cerebral vessels patent at the base of the neck.   No pedunculated plaque, ulcerated plaque, dissection, periaortic fluid. No wall thickening.   Pulmonary arteries:   Timing of the contrast bolus is not optimized for evaluation of pulmonary artery filling defects. Unremarkable size of the main pulmonary artery.   Mediastinum/Nodes: No mediastinal adenopathy. Unremarkable appearance of the thoracic esophagus.   Unremarkable appearance of the thoracic inlet.   Lungs/Pleura: Central airways are clear. No pleural effusion. No confluent airspace disease.   No pneumothorax.   Upper Abdomen: No acute finding of the upper abdomen.   Musculoskeletal: Degenerative changes of the visualized spine. No bony canal narrowing. No acute displaced fracture.   Review of the MIP images confirms the above findings.   IMPRESSION: Essentially unchanged size and configuration of the  ascending aorta, estimated 4.0 cm on the current CT. Recommend annual imaging followup by CTA or MRA. This recommendation follows 2010 ACCF/AHA/AATS/ACR/ASA/SCA/SCAI/SIR/STS/SVM Guidelines for the Diagnosis and Management of Patients with Thoracic Aortic Disease. Circulation. 2010; 121: W098-J191. Aortic aneurysm NOS (ICD10-I71.9)   Coronary artery disease and aortic atherosclerosis. Aortic Atherosclerosis (ICD10-I70.0).   Signed,   Yvone Neu. Miachel Roux, RPVI   [facility]   [facility]     Electronically Signed   By: Gilmer Mor D.O.   On: 07/07/2023 11:36       Assessment & Plan:   Seen for surveillance of 4.0 ascending thoracic aneurysm.  We discussed routine lifestyle issues as well as good control of blood pressure.  He notes his blood pressure today is atypical but is agreeable to monitoring more closely.  We will see him again in 1 year with a repeat chest CTA.     Problem List Items Addressed This Visit     Aneurysm of ascending aorta without rupture (HCC) - Primary    No orders of the defined types were placed in this encounter.   No follow-ups on file.  Rowe Clack, PA-C

## 2023-08-22 ENCOUNTER — Ambulatory Visit: Payer: Medicare Other

## 2023-08-22 VITALS — BP 132/80 | HR 53 | Ht 69.0 in | Wt 241.4 lb

## 2023-08-22 DIAGNOSIS — Z0000 Encounter for general adult medical examination without abnormal findings: Secondary | ICD-10-CM

## 2023-08-22 DIAGNOSIS — Z23 Encounter for immunization: Secondary | ICD-10-CM | POA: Diagnosis not present

## 2023-08-22 NOTE — Progress Notes (Cosign Needed Addendum)
Subjective:   Grant Edwards is a 82 y.o. male who presents for an Initial Medicare Annual Wellness Visit.  Visit Complete: In person   Cardiac Risk Factors include: advanced age (>66men, >35 women);male gender;hypertension;Other (see comment), Risk factor comments: AAA, OSA, Aortic atherosclerosis    Objective:    Today's Vitals   08/22/23 1336  BP: 132/80  Pulse: (!) 53  SpO2: 98%  Weight: 241 lb 6.4 oz (109.5 kg)  Height: 5\' 9"  (1.753 m)   Body mass index is 35.65 kg/m.     08/22/2023    1:57 PM 03/05/2022    7:14 PM 02/22/2022    1:45 PM 02/09/2022    2:34 PM 07/26/2016    9:09 AM  Advanced Directives  Does Patient Have a Medical Advance Directive? Yes Yes Yes Yes Yes  Type of Estate agent of [place];Living will Healthcare Power of [employer] Power of [employer] Power of [place];Living will Healthcare Power of [place];Living will  Does patient want to make changes to medical advance directive? No - Patient declined No - Patient declined No - Patient declined No - Patient declined   Copy of Healthcare Power of Attorney in Chart? Yes - validated most recent copy scanned in chart (See row information)   No - copy requested   Would patient like information on creating a medical advance directive?  No - Patient declined       Current Medications (verified) Outpatient Encounter Medications as of 08/22/2023  Medication Sig   acetaminophen (TYLENOL) 650 MG CR tablet Take 650 mg by mouth every 6 (six) hours as needed for pain.   albuterol (VENTOLIN HFA) 108 (90 Base) MCG/ACT inhaler Inhale 2 puffs into the lungs every 4 (four) hours as needed for wheezing or shortness of breath.   allopurinol (ZYLOPRIM) 300 MG tablet TAKE 1 TABLET BY MOUTH EVERY DAY IN THE MORNING   atorvastatin (LIPITOR) 20 MG tablet Take 20 mg by mouth in the morning.   Azelastine HCl 137 MCG/SPRAY SOLN Place 1 spray into both nostrils daily as needed (congestion).    Cholecalciferol (VITAMIN D3) 250 MCG (10000 UT) capsule Take 10,000 Units by mouth every 3 (three) days.   clobetasol cream (TEMOVATE) 0.05 % Apply 1 application. topically daily as needed (break outs).   colchicine 0.6 MG tablet Take 0.6-1.2 mg by mouth daily as needed (gout flares.).   ezetimibe (ZETIA) 10 MG tablet TAKE 1 TABLET BY MOUTH EVERY DAY   Faricimab-svoa (VABYSMO IZ) by Intravitreal route.   fluticasone (FLONASE) 50 MCG/ACT nasal spray Place 1 spray into both nostrils at bedtime.   furosemide (LASIX) 20 MG tablet Take 20 mg by mouth in the morning.   lisinopril (PRINIVIL,ZESTRIL) 40 MG tablet Take 40 mg by mouth in the morning.   metoprolol succinate (TOPROL-XL) 25 MG 24 hr tablet Take 25 mg by mouth in the morning.   vitamin E 180 MG (400 UNITS) capsule Take 400 Units by mouth daily.   No facility-administered encounter medications on file as of 08/22/2023.    Allergies (verified) Aspirin, Azithromycin, Nsaids, and Erythromycin   History: Past Medical History:  Diagnosis Date   AAA (abdominal aortic aneurysm) (HCC)    abdominal and Acending Aortic    Arthritis    Asthma    PRN inhaler   Claustrophobia    Coronary artery disease    Eczema    Gout    History of kidney stones    Hx of adenomatous colonic polyps  Hyperlipidemia    Hypertension    Macular degeneration    Pneumonia    82 years old   Sleep apnea    CPAP  13 setting   Past Surgical History:  Procedure Laterality Date   COLONOSCOPY W/ POLYPECTOMY  last 2015   multiple   knee intercompartmental replacement  2008   Dr. Thurston Hole    POLYPECTOMY     RETINAL LASER PROCEDURE  2010   RHINOPLASTY  1976   TONSILLECTOMY     TOTAL KNEE ARTHROPLASTY Right 02/22/2022   Procedure: TOTAL KNEE ARTHROPLASTY;  Surgeon: Ollen Gross, MD;  Location: [facility];  Service: Orthopedics;  Laterality: Right;   Family History  Problem Relation Age of Onset   Cancer Mother    Allergic rhinitis Mother    Breast cancer  Mother    Heart disease Father    Heart attack Father    Allergic rhinitis Son    Angioedema Neg Hx    Asthma Neg Hx    Eczema Neg Hx    Immunodeficiency Neg Hx    Urticaria Neg Hx    Colon cancer Neg Hx    Colon polyps Neg Hx    Esophageal cancer Neg Hx    Rectal cancer Neg Hx    Stomach cancer Neg Hx    Social History   Socioeconomic History   Marital status: Legally Separated    Spouse name: Not on file   Number of children: 3   Years of education: Not on file   Highest education level: Not on file  Occupational History   Occupation: Retired Charity fundraiser  Tobacco Use   Smoking status: Never   Smokeless tobacco: Never  Vaping Use   Vaping status: Never Used  Substance and Sexual Activity   Alcohol use: No    Alcohol/week: 0.0 standard drinks of alcohol   Drug use: No   Sexual activity: Not Currently  Other Topics Concern   Not on file  Social History Narrative   Lives alone and one dog.   Social Determinants of Health   Financial Resource Strain: Low Risk  (08/22/2023)   Overall Financial Resource Strain (CARDIA)    Difficulty of Paying Living Expenses: Not hard at all  Food Insecurity: No Food Insecurity (08/22/2023)   Hunger Vital Sign    Worried About Running Out of Food in the Last Year: Never true    Ran Out of Food in the Last Year: Never true  Transportation Needs: No Transportation Needs (08/22/2023)   PRAPARE - Administrator, Civil Service (Medical): No    Lack of Transportation (Non-Medical): No  Physical Activity: Inactive (08/22/2023)   Exercise Vital Sign    Days of Exercise per Week: 0 days    Minutes of Exercise per Session: 0 min  Stress: Stress Concern Present (08/22/2023)   [employer] of Occupational Health - Occupational Stress Questionnaire    Feeling of Stress : To some extent  Social Connections: Moderately Integrated (08/22/2023)   Social Connection and Isolation Panel [NHANES]    Frequency of Communication with Friends  and Family: More than three times a week    Frequency of Social Gatherings with Friends and Family: Twice a week    Attends Religious Services: More than 4 times per year    Active Member of [employer] or Organizations: Yes    Attends Banker Meetings: More than 4 times per year    Marital Status: Separated    Tobacco Counseling Counseling  given: Not Answered   Clinical Intake:  Pre-visit preparation completed: Yes  Pain : 0-10 Pain Location: Knee Pain Orientation: Right Pain Onset: More than a month ago Pain Frequency: Intermittent     BMI - recorded: 35.65 Nutritional Status: BMI > 30  Obese Nutritional Risks: None Diabetes: No  How often do you need to have someone help you when you read instructions, pamphlets, or other written materials from your doctor or pharmacy?: 1 - Never  Interpreter Needed?: No      Activities of Daily Living    08/22/2023    1:47 PM  In your present state of health, do you have any difficulty performing the following activities:  Hearing? 0  Vision? 0  Difficulty concentrating or making decisions? 0  Walking or climbing stairs? 1  Dressing or bathing? 0  Doing errands, shopping? 0  Preparing Food and eating ? N  Using the Toilet? N  In the past six months, have you accidently leaked urine? Y  Do you have problems with loss of bowel control? N  Managing your Medications? N  Managing your Finances? N  Housekeeping or managing your Housekeeping? N    Patient Care Team: Elenore Paddy, NP as PCP - General (Nurse Practitioner) Stephannie Li, MD as Consulting Physician (Ophthalmology)  Indicate any recent Medical Services you may have received from other than [facility] providers in the past year (date may be approximate).     Assessment:   This is a routine wellness examination for [place].  Hearing/Vision screen Hearing Screening - Comments:: Denies hearing difficulties     Goals Addressed               This  Visit's Progress     Patient Stated (pt-stated)        Would like to lose a couple of pounds.      Depression Screen    08/22/2023    2:03 PM 06/16/2023   10:41 AM  PHQ 2/9 Scores  PHQ - 2 Score 1 0  PHQ- 9 Score 2     Fall Risk    08/22/2023    1:57 PM 06/16/2023   10:41 AM  Fall Risk   Falls in the past year? 0 1  Number falls in past yr: 0 0  Injury with Fall? 0 0  Risk for fall due to : No Fall Risks History of fall(s)  Follow up Falls prevention discussed;Falls evaluation completed Falls evaluation completed    MEDICARE RISK AT HOME: Medicare Risk at Home Any stairs in or around the home?: No Home free of loose throw rugs in walkways, pet beds, electrical cords, etc?: Yes Adequate lighting in your home to reduce risk of falls?: Yes Life alert?: Yes (on watch) Use of a cane, walker or w/c?: Yes (sometimes) Grab bars in the bathroom?: No Shower chair or bench in shower?: Yes Elevated toilet seat or a handicapped toilet?: Yes  TIMED UP AND GO:  Was the test performed? Yes  Length of time to ambulate 10 feet: 20 sec Gait slow and steady without use of assistive device    Cognitive Function:        08/22/2023    1:58 PM  6CIT Screen  What Year? 0 points  What month? 0 points  What time? 0 points  Count back from 20 0 points  Months in reverse 2 points  Repeat phrase 0 points  Total Score 2 points    Immunizations Immunization History  Administered Date(s)  Administered   Fluad Trivalent(High Dose 65+) 08/22/2023   PFIZER(Purple Top)SARS-COV-2 Vaccination 12/05/2019, 12/26/2019   PNEUMOCOCCAL CONJUGATE-20 09/03/2021   Tdap 04/11/1995   Zoster, Unspecified 11/25/1992    TDAP status: Due, Education has been provided regarding the importance of this vaccine. Advised may receive this vaccine at local pharmacy or Health Dept. Aware to provide a copy of the vaccination record if obtained from local pharmacy or Health Dept. Verbalized acceptance and  understanding.  Flu Vaccine status: Completed at today's visit  Pneumococcal vaccine status: Up to date  Covid-19 vaccine status: Completed vaccines  Qualifies for Shingles Vaccine? Yes   Zostavax completed Yes   Shingrix Completed?: No.    Education has been provided regarding the importance of this vaccine. Patient has been advised to call insurance company to determine out of pocket expense if they have not yet received this vaccine. Advised may also receive vaccine at local pharmacy or Health Dept. Verbalized acceptance and understanding.  Screening Tests Health Maintenance  Topic Date Due   Zoster Vaccines- Shingrix (1 of 2) 11/25/1992   DTaP/Tdap/Td (2 - Td or Tdap) 04/10/2005   COVID-19 Vaccine (3 - 2023-24 season) 07/10/2023   Medicare Annual Wellness (AWV)  08/21/2024   Pneumonia Vaccine 68+ Years old  Completed   INFLUENZA VACCINE  Completed   HPV VACCINES  Aged Out    Health Maintenance  Health Maintenance Due  Topic Date Due   Zoster Vaccines- Shingrix (1 of 2) 11/25/1992   DTaP/Tdap/Td (2 - Td or Tdap) 04/10/2005   COVID-19 Vaccine (3 - 2023-24 season) 07/10/2023    Colorectal cancer screening: No longer required.   Lung Cancer Screening: (Low Dose CT Chest recommended if Age 31-80 years, 20 pack-year currently smoking OR have quit w/in 15years.) does not qualify.   Lung Cancer Screening Referral: N/A  Additional Screening:  Hepatitis C Screening: does not qualify;  Vision Screening: Recommended annual ophthalmology exams for early detection of glaucoma and other disorders of the eye. Is the patient up to date with their annual eye exam?  No  Who is the provider or what is the name of the office in which the patient attends annual eye exams? Dr. Allyne Gee If pt is not established with a provider, would they like to be referred to a provider to establish care? No .   Dental Screening: Recommended annual dental exams for proper oral hygiene   Community  Resource Referral / Chronic Care Management: CRR required this visit?  No   CCM required this visit?  No    Plan:     I have personally reviewed and noted the following in the patient's chart:   Medical and social history Use of alcohol, tobacco or illicit drugs  Current medications and supplements including opioid prescriptions. Patient is not currently taking opioid prescriptions. Functional ability and status Nutritional status Physical activity Advanced directives List of other physicians Hospitalizations, surgeries, and ER visits in previous 12 months Vitals Screenings to include cognitive, depression, and falls Referrals and appointments  In addition, I have reviewed and discussed with patient certain preventive protocols, quality metrics, and best practice recommendations. A written personalized care plan for preventive services as well as general preventive health recommendations were provided to patient.     Ervin Rothbauer L Paulo Keimig, CMA   08/22/2023   After Visit Summary: (MyChart) Due to this being a telephonic visit, the after visit summary with patients personalized plan was offered to patient via MyChart   Nurse Notes: Patient is due  for a Shingrix vaccine.  He is also due for a Tdap.  Patient had no other concerns to address today.   Medical screening examination/treatment/procedure(s) were performed by non-physician practitioner and as supervising physician I was immediately available for consultation/collaboration.  I agree with above. Jacinta Shoe, MD

## 2023-08-22 NOTE — Patient Instructions (Signed)
Mr. Conradt , Thank you for taking time to come for your Medicare Wellness Visit. I appreciate your ongoing commitment to your health goals. Please review the following plan we discussed and let me know if I can assist you in the future.   Referrals/Orders/Follow-Ups/Clinician Recommendations: You are due for a Shingles vaccine and a tetanus vaccine.  It was so nice talking to you today.  Happy holidays to you.    This is a list of the screening recommended for you and due dates:  Health Maintenance  Topic Date Due   DTaP/Tdap/Td vaccine (1 - Tdap) Never done   Zoster (Shingles) Vaccine (1 of 2) Never done   Pneumonia Vaccine (1 of 1 - PCV) Never done   Flu Shot  06/09/2023   COVID-19 Vaccine (3 - 2023-24 season) 07/10/2023   Medicare Annual Wellness Visit  08/21/2024   HPV Vaccine  Aged Out    Advanced directives: (In Chart) A copy of your advanced directives are scanned into your chart should your provider ever need it.  Next Medicare Annual Wellness Visit scheduled for next year: Yes

## 2023-09-08 ENCOUNTER — Telehealth: Payer: Self-pay | Admitting: Nurse Practitioner

## 2023-09-08 MED ORDER — METOPROLOL SUCCINATE ER 25 MG PO TB24: 25.0000 mg | ORAL_TABLET | Freq: Every morning | ORAL | 1 refills | Status: DC

## 2023-09-08 NOTE — Telephone Encounter (Signed)
Prescription Request  09/08/2023  LOV: 06/16/2023  What is the name of the medication or equipment?  metoprolol succinate (TOPROL-XL) 25 MG 24 hr tablet  Have you contacted your pharmacy to request a refill? Yes   Which pharmacy would you like this sent to?  [id number] - [place], [place] - [address] [place] [address] Phone: [phone] Fax: [phone]    Patient notified that their request is being sent to the clinical staff for review and that they should receive a response within 2 business days.   Please advise at [facility] [phone]

## 2023-09-08 NOTE — Telephone Encounter (Signed)
Medication send in to pt prefer pharmacy

## 2023-09-16 ENCOUNTER — Ambulatory Visit (INDEPENDENT_AMBULATORY_CARE_PROVIDER_SITE_OTHER): Payer: Medicare Other | Admitting: Nurse Practitioner

## 2023-09-16 VITALS — BP 122/70 | HR 70 | Temp 98.7°F | Ht 69.0 in | Wt 241.0 lb

## 2023-09-16 DIAGNOSIS — I7121 Aneurysm of the ascending aorta, without rupture: Secondary | ICD-10-CM | POA: Diagnosis not present

## 2023-09-16 DIAGNOSIS — L0291 Cutaneous abscess, unspecified: Secondary | ICD-10-CM

## 2023-09-16 DIAGNOSIS — E66812 Obesity, class 2: Secondary | ICD-10-CM | POA: Diagnosis not present

## 2023-09-16 DIAGNOSIS — R351 Nocturia: Secondary | ICD-10-CM

## 2023-09-16 DIAGNOSIS — Z6835 Body mass index (BMI) 35.0-35.9, adult: Secondary | ICD-10-CM | POA: Diagnosis not present

## 2023-09-16 LAB — CBC
HCT: 42.6 % (ref 39.0–52.0)
Hemoglobin: 14 g/dL (ref 13.0–17.0)
MCHC: 33 g/dL (ref 30.0–36.0)
MCV: 89.5 fL (ref 78.0–100.0)
Platelets: 177 10*3/uL (ref 150.0–400.0)
RBC: 4.76 Mil/uL (ref 4.22–5.81)
RDW: 15.6 % — ABNORMAL HIGH (ref 11.5–15.5)
WBC: 6.5 10*3/uL (ref 4.0–10.5)

## 2023-09-16 LAB — COMPREHENSIVE METABOLIC PANEL
ALT: 9 U/L (ref 0–53)
AST: 15 U/L (ref 0–37)
Albumin: 4.1 g/dL (ref 3.5–5.2)
Alkaline Phosphatase: 80 U/L (ref 39–117)
BUN: 18 mg/dL (ref 6–23)
CO2: 27 meq/L (ref 19–32)
Calcium: 9.3 mg/dL (ref 8.4–10.5)
Chloride: 104 meq/L (ref 96–112)
Creatinine, Ser: 1 mg/dL (ref 0.40–1.50)
GFR: 70.93 mL/min (ref >60.00)
Glucose, Bld: 91 mg/dL (ref 70–99)
Potassium: 4.2 meq/L (ref 3.5–5.1)
Sodium: 139 meq/L (ref 135–145)
Total Bilirubin: 1.1 mg/dL (ref 0.2–1.2)
Total Protein: 6.6 g/dL (ref 6.0–8.3)

## 2023-09-16 LAB — LIPID PANEL
Cholesterol: 107 mg/dL (ref 0–200)
HDL: 39.6 mg/dL (ref >39.00)
LDL Cholesterol: 44 mg/dL (ref 0–99)
NonHDL: 66.94
Total CHOL/HDL Ratio: 3
Triglycerides: 114 mg/dL (ref 0.0–149.0)
VLDL: 22.8 mg/dL (ref 0.0–40.0)

## 2023-09-16 LAB — HEMOGLOBIN A1C: Hgb A1c MFr Bld: 6.2 % (ref 4.6–6.5)

## 2023-09-16 LAB — TSH: TSH: 1.1 u[IU]/mL (ref 0.35–5.50)

## 2023-09-16 LAB — PSA: PSA: 0.58 ng/mL (ref 0.10–4.00)

## 2023-09-16 MED ORDER — DOXYCYCLINE HYCLATE 100 MG PO TABS: 100.0000 mg | ORAL_TABLET | Freq: Two times a day (BID) | ORAL | 0 refills | Status: DC

## 2023-09-16 NOTE — Assessment & Plan Note (Signed)
Chronic Continue to follow-up with cardiothoracic surgery as scheduled. Blood pressure well-controlled today.

## 2023-09-16 NOTE — Progress Notes (Signed)
Established Patient Office Visit  Subjective   Patient ID: Grant Edwards, male    DOB: August 29, 1941  Age: 82 y.o. MRN: [id number]  Chief Complaint  Patient presents with   Facial Swelling    Facial swelling: Located to right cheek preauricular area.  Started 3 to 4 days ago.  Thinks it may have started out as a bug bite.  Area is swollen and painful to touch.  Denies any fevers.  Denies any known dental issues.  Does have some discomfort with moving his job but not any discomfort in teeth when chewing.  Nocturia: Wakes up at least 2 times a night to use the bathroom for urination.  Denies any weak urine stream, dribbling, or difficulty initiating a stream.  Would like to have his PSA checked today.  Abdominal aortic aneurysm: Follows with cardiothoracic surgery.  Per their last office visit aneurysm is stable and they plan on repeating CTA in 1 year.  Obesity/hyperlipidemia: Due for lipid panel to be checked today.  Continues on Zetia 10 mg daily.    Review of Systems  Constitutional:  Negative for fever.      Objective:     BP 122/70   Pulse 70   Temp 98.7 F (37.1 C) (Temporal)   Ht 5\' 9"  (1.753 m)   Wt 241 lb (109.3 kg)   SpO2 93%   BMI 35.59 kg/m  BP Readings from Last 3 Encounters:  09/16/23 122/70  08/22/23 132/80  07/13/23 (!) 150/80   Wt Readings from Last 3 Encounters:  09/16/23 241 lb (109.3 kg)  08/22/23 241 lb 6.4 oz (109.5 kg)  07/13/23 235 lb (106.6 kg)      Physical Exam Vitals reviewed.  Constitutional:      Appearance: Normal appearance.  HENT:     Head: Normocephalic and atraumatic.      Right Ear: Hearing, tympanic membrane and ear canal normal.  Cardiovascular:     Rate and Rhythm: Normal rate and regular rhythm.  Pulmonary:     Effort: Pulmonary effort is normal.     Breath sounds: Normal breath sounds.  Musculoskeletal:     Cervical back: Neck supple.  Skin:    General: Skin is warm and dry.  Neurological:     Mental Status:  He is alert and oriented to person, place, and time.  Psychiatric:        Mood and Affect: Mood normal.        Behavior: Behavior normal.        Thought Content: Thought content normal.        Judgment: Judgment normal.      No results found for any visits on 09/16/23.    The ASCVD Risk score (Arnett DK, et al., 2019) failed to calculate for the following reasons:   The 2019 ASCVD risk score is only valid for ages 49 to 8    Assessment & Plan:   Problem List Items Addressed This Visit       Cardiovascular and Mediastinum   Aneurysm of ascending aorta without rupture (HCC)    Chronic Continue to follow-up with cardiothoracic surgery as scheduled. Blood pressure well-controlled today.        Other   Abscess - Primary    Swollen area appears to be an abscess, may be initially started with bug bite.  No open or draining areas so we will treat with oral doxycycline 100 mg twice daily x 10 days.  Patient has an appoint with me next  week he will keep this for close follow-up.  Patient encouraged to proceed to the emergency department if symptoms worsen between now and next appointment.      Relevant Medications   doxycycline (VIBRA-TABS) 100 MG tablet   Other Relevant Orders   CBC   Comprehensive metabolic panel   Nocturia    Chronic We did discuss prostate cancer screening is not routinely recommended in someone without symptoms of his age.  However because he is having nocturia we can check a PSA today for further evaluation.  Further recommendations may be made based upon his results.      Relevant Orders   PSA   Class 2 severe obesity with serious comorbidity and body mass index (BMI) of 35.0 to 35.9 in adult [facility])    Chronic Labs ordered, further recommendations may be made based upon these results       Relevant Orders   Hemoglobin A1c   Lipid panel   TSH    Return for F/U with Kevin Mario as scheduled.    Elenore Paddy, NP

## 2023-09-16 NOTE — Assessment & Plan Note (Signed)
Chronic We did discuss prostate cancer screening is not routinely recommended in someone without symptoms of his age.  However because he is having nocturia we can check a PSA today for further evaluation.  Further recommendations may be made based upon his results.

## 2023-09-16 NOTE — Assessment & Plan Note (Signed)
Chronic Labs ordered, further recommendations may be made based upon these results.

## 2023-09-16 NOTE — Assessment & Plan Note (Signed)
Swollen area appears to be an abscess, may be initially started with bug bite.  No open or draining areas so we will treat with oral doxycycline 100 mg twice daily x 10 days.  Patient has an appoint with me next week he will keep this for close follow-up.  Patient encouraged to proceed to the emergency department if symptoms worsen between now and next appointment.

## 2023-09-22 ENCOUNTER — Ambulatory Visit: Payer: Medicare Other | Admitting: Nurse Practitioner

## 2023-09-22 VITALS — BP 134/80 | HR 56 | Temp 97.9°F | Ht 69.0 in | Wt 237.0 lb

## 2023-09-22 DIAGNOSIS — I700 Atherosclerosis of aorta: Secondary | ICD-10-CM

## 2023-09-22 DIAGNOSIS — L0291 Cutaneous abscess, unspecified: Secondary | ICD-10-CM | POA: Diagnosis not present

## 2023-09-22 NOTE — Assessment & Plan Note (Signed)
Drastically improved, continue doxycycline until all tablets have been taken.

## 2023-09-22 NOTE — Progress Notes (Signed)
   Established Patient Office Visit  Subjective   Patient ID: Grant Edwards, male    DOB: 24-Nov-1940  Age: 82 y.o. MRN: [id number]  Chief Complaint  Patient presents with   Medical Management of Chronic Issues    Follow up on on the bump on the right ear, has gotten better with the antibiotics      HLD: Patient continues on atorvastatin 20mg /day and Zetia 10 mg daily.  Last LDL 44. Facial Abscess: Drastically improved since last office visit.  Patient has about 3 more days of doxycycline left.  No fever or pain.     ROS: see HPI    Objective:     BP 134/80   Pulse (!) 56   Temp 97.9 F (36.6 C) (Temporal)   Ht 5\' 9"  (1.753 m)   Wt 237 lb (107.5 kg)   SpO2 97%   BMI 35.00 kg/m  BP Readings from Last 3 Encounters:  09/22/23 134/80  09/16/23 122/70  08/22/23 132/80   Wt Readings from Last 3 Encounters:  09/22/23 237 lb (107.5 kg)  09/16/23 241 lb (109.3 kg)  08/22/23 241 lb 6.4 oz (109.5 kg)      Physical Exam Vitals reviewed.  Constitutional:      Appearance: Normal appearance.  HENT:     Head: Normocephalic and atraumatic.  Cardiovascular:     Rate and Rhythm: Normal rate and regular rhythm.  Pulmonary:     Effort: Pulmonary effort is normal.     Breath sounds: Normal breath sounds.  Musculoskeletal:     Cervical back: Neck supple.  Skin:    General: Skin is warm and dry.  Neurological:     Mental Status: He is alert and oriented to person, place, and time.  Psychiatric:        Mood and Affect: Mood normal.        Behavior: Behavior normal.        Thought Content: Thought content normal.        Judgment: Judgment normal.      No results found for any visits on 09/22/23.    The ASCVD Risk score (Arnett DK, et al., 2019) failed to calculate for the following reasons:   The 2019 ASCVD risk score is only valid for ages 48 to 35    Assessment & Plan:   Problem List Items Addressed This Visit       Cardiovascular and Mediastinum   Aortic  atherosclerosis (HCC) - Primary    Chronic LDL at goal Continue torsemide 20 mg daily and Zetia 10 mg daily        Other   Abscess    Drastically improved, continue doxycycline until all tablets have been taken.       Return in about 6 months (around 03/21/2024) for F/U with Maralyn Sago.    Elenore Paddy, NP

## 2023-09-22 NOTE — Assessment & Plan Note (Signed)
Chronic LDL at goal Continue torsemide 20 mg daily and Zetia 10 mg daily

## 2024-01-05 ENCOUNTER — Telehealth: Payer: Self-pay | Admitting: Nurse Practitioner

## 2024-01-05 DIAGNOSIS — G4733 Obstructive sleep apnea (adult) (pediatric): Secondary | ICD-10-CM

## 2024-01-05 NOTE — Telephone Encounter (Signed)
 Please call patient to verify whether he needs cpap supplies only or if he needs a new order for cpap and its settings. I can sign order for the supplies, but I do not menage the cpap settings so he should be seeing a sleep specialist for this. If he does not have a sleep specialist I can order referral for him.

## 2024-01-05 NOTE — Telephone Encounter (Signed)
 I will order referral to sleep specialist so that they can get him a new machine and supplies. They should call him in about a week to setup an appointment for consultation.

## 2024-01-05 NOTE — Addendum Note (Signed)
 Addended by: Elenore Paddy on: 01/05/2024 10:10 AM   Modules accepted: Orders

## 2024-01-05 NOTE — Telephone Encounter (Signed)
 Called pt, pt stated he is ok with having a new order for his cpap as the one he is using is not working

## 2024-01-26 ENCOUNTER — Telehealth: Payer: Self-pay | Admitting: Neurology

## 2024-01-26 NOTE — Telephone Encounter (Signed)
Patient called to verify appointment 

## 2024-02-01 ENCOUNTER — Telehealth: Payer: Self-pay

## 2024-02-01 NOTE — Telephone Encounter (Signed)
 Called pt and asked him to bring his CPAP Machine with him to his appointment on tomorrow 02/02/2024

## 2024-02-02 ENCOUNTER — Ambulatory Visit: Admitting: Neurology

## 2024-02-02 ENCOUNTER — Encounter: Payer: Self-pay | Admitting: Neurology

## 2024-02-02 VITALS — BP 152/86 | HR 53 | Ht 71.5 in | Wt 238.0 lb

## 2024-02-02 DIAGNOSIS — E66811 Obesity, class 1: Secondary | ICD-10-CM

## 2024-02-02 DIAGNOSIS — R635 Abnormal weight gain: Secondary | ICD-10-CM

## 2024-02-02 DIAGNOSIS — G4733 Obstructive sleep apnea (adult) (pediatric): Secondary | ICD-10-CM

## 2024-02-02 NOTE — Patient Instructions (Signed)
 It was nice to meet you today.  As discussed, I will order a home sleep test for re-evaluation of your sleep apnea, as testing was several years ago. The home sleep test is done (HST) to re-establish/confirm the sleep apnea diagnosis and to get your a new machine through the insurance. Our sleep lab staff will reach out to you to arrange for pickup and for tutorial of the test equipment. I will write for a new machine after the HST confirms the OSA (obstructive sleep apnea) diagnosis. Please remember, you will not use your CPAP machine during the night of testing so we get diagnostic data, not treatment data. We will schedule a follow-up appointment after the new machine set-up date, typically within 31 to 89 days post treatment start.  Per insurance requirement, you will need to show compliance with usage and fulfill a minimum usage percentage.   If you don't sleep well without CPAP, once your test is done and you have time to take a nap without the test sensor(s) on, but with your CPAP on, you may want to do that.
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
## 2024-02-09 ENCOUNTER — Telehealth: Payer: Self-pay | Admitting: Neurology

## 2024-02-09 NOTE — Telephone Encounter (Signed)
 Pt called to speak with Sleep Lab to schedule a sleep study. She left a voicemail to call to schedule and  Grant Edwards would be off tomorrow. Trying to get scheduled before Grant Edwards is off tomorrow

## 2024-02-14 ENCOUNTER — Encounter

## 2024-02-15 ENCOUNTER — Telehealth: Payer: Self-pay | Admitting: Nurse Practitioner

## 2024-02-15 NOTE — Telephone Encounter (Signed)
 Copied from CRM [id number]. Topic: General - Other >> Feb 15, 2024  9:33 AM Elizebeth Brooking wrote: Reason for CRM: [employer] called in regarding if the cpap supplies form had been received by the office, stated they will be sending it again to be signed and returned

## 2024-02-22 ENCOUNTER — Ambulatory Visit: Admitting: Nurse Practitioner

## 2024-02-22 VITALS — BP 148/92 | HR 54 | Temp 97.9°F | Ht 71.0 in | Wt 237.0 lb

## 2024-02-22 DIAGNOSIS — I10 Essential (primary) hypertension: Secondary | ICD-10-CM | POA: Diagnosis not present

## 2024-02-22 MED ORDER — TELMISARTAN 40 MG PO TABS: 40.0000 mg | ORAL_TABLET | Freq: Every day | ORAL | 1 refills | Status: DC

## 2024-02-22 NOTE — Assessment & Plan Note (Signed)
 Chronic, above goal Per shared decision making patient will continue metoprolol 25 mg daily.  We will transition from lisinopril to telmisartan 40 mg daily. He will follow-up in 1 week for close monitoring of blood pressure and to recheck metabolic panel.

## 2024-02-22 NOTE — Patient Instructions (Addendum)
 Monitor BP - if you see BP >140/90, after being on telmisartan for 3 days you can increase dose from 40mg /day (1 tablet by mouth per day) to 80mg /day (2 tablets by mouth per day).

## 2024-02-22 NOTE — Progress Notes (Signed)
   Established Patient Office Visit  Subjective   Patient ID: Grant Edwards, male    DOB: August 29, 1941  Age: 83 y.o. MRN: [id number]  Chief Complaint  Patient presents with   Hypertension    Patient reports he recently had cataract surgery.  While being evaluated and treated for cataracts blood pressure has been checked and he has been told that blood pressure is running high.  Patient has abdominal and thoracic aneurysm and is very concerned regarding maintaining well-controlled blood pressure for treatment of these aneurysms.  Currently he is on lisinopril 40 mg daily and metoprolol 25 mg daily.  He is tolerating his medications well.  He is on prednisone eyedrops as prescribed by his eye doctor who completed cataract surgery.  He also is concerned he may be experiencing worsening sleep apnea, he is on CPAP.  [facility]    ROS: see HPI    Objective:     BP (!) 148/92   Pulse (!) 54   Temp 97.9 F (36.6 C) (Temporal)   Ht 5\' 11"  (1.803 m)   Wt 237 lb (107.5 kg)   SpO2 95%   BMI 33.05 kg/m  BP Readings from Last 3 Encounters:  02/22/24 (!) 148/92  02/02/24 (!) 152/86  09/22/23 134/80   Wt Readings from Last 3 Encounters:  02/22/24 237 lb (107.5 kg)  02/02/24 238 lb (108 kg)  09/22/23 237 lb (107.5 kg)      Physical Exam Vitals reviewed.  Constitutional:      Appearance: Normal appearance.  HENT:     Head: Normocephalic and atraumatic.  Cardiovascular:     Rate and Rhythm: Normal rate and regular rhythm.  Pulmonary:     Effort: Pulmonary effort is normal.     Breath sounds: Normal breath sounds.  Musculoskeletal:     Cervical back: Neck supple.  Skin:    General: Skin is warm and dry.  Neurological:     Mental Status: He is alert and oriented to person, place, and time.  Psychiatric:        Mood and Affect: Mood normal.        Behavior: Behavior normal.        Thought Content: Thought content normal.        Judgment:  Judgment normal.      No results found for any visits on 02/22/24.    The ASCVD Risk score (Arnett DK, et al., 2019) failed to calculate for the following reasons:   The 2019 ASCVD risk score is only valid for ages 25 to 58    Assessment & Plan:   Problem List Items Addressed This Visit       Cardiovascular and Mediastinum   Essential hypertension - Primary   Chronic, above goal Per shared decision making patient will continue metoprolol 25 mg daily.  We will transition from lisinopril to telmisartan 40 mg daily. He will follow-up in 1 week for close monitoring of blood pressure and to recheck metabolic panel.      Relevant Medications   telmisartan (MICARDIS) 40 MG tablet    Return for as scheduled.    Zorita Hiss, NP

## 2024-02-28 ENCOUNTER — Telehealth: Payer: Self-pay

## 2024-02-28 NOTE — Telephone Encounter (Signed)
 Copied from CRM [id number]. Topic: General - Other >> Feb 28, 2024  8:21 AM Juluis Ok wrote: Reason for CRM: Patient requesting a callback from his provider, Adella Agee, NP. He states he was on the phone with her and the line disconnected. Patient requesting a callback.

## 2024-02-29 ENCOUNTER — Ambulatory Visit (INDEPENDENT_AMBULATORY_CARE_PROVIDER_SITE_OTHER): Admitting: Nurse Practitioner

## 2024-02-29 ENCOUNTER — Ambulatory Visit: Attending: Nurse Practitioner

## 2024-02-29 VITALS — BP 118/80 | HR 60 | Temp 98.0°F | Ht 71.0 in

## 2024-02-29 DIAGNOSIS — I10 Essential (primary) hypertension: Secondary | ICD-10-CM

## 2024-02-29 DIAGNOSIS — R001 Bradycardia, unspecified: Secondary | ICD-10-CM

## 2024-02-29 MED ORDER — METOPROLOL SUCCINATE ER 25 MG PO TB24: 12.5000 mg | ORAL_TABLET | Freq: Every morning | ORAL | 1 refills | Status: AC

## 2024-02-29 NOTE — Progress Notes (Signed)
 Established Patient Office Visit  Subjective   Patient ID: Grant Edwards, male    DOB: February 16, 1941  Age: 83 y.o. MRN: [id number]  Chief Complaint  Patient presents with   Hypertension    Hypertension/bradycardia: He was switched to telmisartan  at last office visit to see if we could control blood pressure better.  Clinically he feels well since making the switch.  Blood pressure today much better at 118/80.  He reports heart rates do seem to be a bit lower than normal for him.  Especially at night when he sleeping.  He is planning on undergoing repeat sleep study as he does have obstructive sleep apnea and uses CPAP at there is a question about whether or not the settings need to be adjusted.  He denies any chest pain, headache, dizziness, shortness of breath.     ROS ; see HPI    Objective:     BP 118/80   Pulse 60   Temp 98 F (36.7 C) (Temporal)   Ht 5\' 11"  (1.803 m)   SpO2 96%   BMI 33.05 kg/m  BP Readings from Last 3 Encounters:  02/29/24 118/80  02/22/24 (!) 148/92  02/02/24 (!) 152/86   Wt Readings from Last 3 Encounters:  02/22/24 237 lb (107.5 kg)  02/02/24 238 lb (108 kg)  09/22/23 237 lb (107.5 kg)      Physical Exam Vitals reviewed.  Constitutional:      Appearance: Normal appearance.  HENT:     Head: Normocephalic and atraumatic.  Cardiovascular:     Rate and Rhythm: Normal rate and regular rhythm.  Pulmonary:     Effort: Pulmonary effort is normal.     Breath sounds: Normal breath sounds.  Musculoskeletal:     Cervical back: Neck supple.  Skin:    General: Skin is warm and dry.  Neurological:     Mental Status: He is alert and oriented to person, place, and time.  Psychiatric:        Mood and Affect: Mood normal.        Behavior: Behavior normal.        Thought Content: Thought content normal.        Judgment: Judgment normal.    EKG: Sinus bradycardia with first-degree AV block, heart rate 52  No results found for any visits on  02/29/24.     The ASCVD Risk score (Arnett DK, et al., 2019) failed to calculate for the following reasons:   The 2019 ASCVD risk score is only valid for ages 69 to 88    Assessment & Plan:   Problem List Items Addressed This Visit       Cardiovascular and Mediastinum   Essential hypertension - Primary   Chronic, much improved Continue telmisartan  40 mg daily.  Check metabolic panel to monitor for kidney function stability and potassium level.  Per shared decision-making because patient is fairly bradycardic, will reduce metoprolol  to 12.5 mg daily from 25 mg daily.  Will also order long-term cardiac monitor for 1 week to see if we can identify any arrhythmias overnight.  Patient to follow-up in 6 weeks or sooner as needed.      Relevant Medications   metoprolol  succinate (TOPROL -XL) 25 MG 24 hr tablet   Other Relevant Orders   Basic metabolic panel with GFR   LONG TERM MONITOR (3-14 DAYS)     Other   Bradycardia   Chronic Continue telmisartan  40 mg daily.  Check metabolic panel to monitor for kidney  function stability and potassium level.  Per shared decision-making because patient is fairly bradycardic, will reduce metoprolol  to 12.5 mg daily from 25 mg daily.  Will also order long-term cardiac monitor for 1 week to see if we can identify any arrhythmias overnight.  Patient to follow-up in 6 weeks or sooner as needed.      Relevant Medications   metoprolol  succinate (TOPROL -XL) 25 MG 24 hr tablet   Other Relevant Orders   LONG TERM MONITOR (3-14 DAYS)   EKG 12-Lead    Return in about 6 weeks (around 04/11/2024) for F/U with Kamariyah Timberlake.    Zorita Hiss, NP

## 2024-02-29 NOTE — Assessment & Plan Note (Signed)
 Chronic Continue telmisartan  40 mg daily.  Check metabolic panel to monitor for kidney function stability and potassium level.  Per shared decision-making because patient is fairly bradycardic, will reduce metoprolol  to 12.5 mg daily from 25 mg daily.  Will also order long-term cardiac monitor for 1 week to see if we can identify any arrhythmias overnight.  Patient to follow-up in 6 weeks or sooner as needed.

## 2024-02-29 NOTE — Progress Notes (Unsigned)
 EP to read.

## 2024-02-29 NOTE — Assessment & Plan Note (Addendum)
 Chronic, much improved Continue telmisartan  40 mg daily.  Check metabolic panel to monitor for kidney function stability and potassium level.  Per shared decision-making because patient is fairly bradycardic, will reduce metoprolol  to 12.5 mg daily from 25 mg daily.  Will also order long-term cardiac monitor for 1 week to see if we can identify any arrhythmias overnight.  Patient to follow-up in 6 weeks or sooner as needed.

## 2024-02-29 NOTE — Telephone Encounter (Signed)
Forms was completed and faxed

## 2024-03-02 ENCOUNTER — Encounter: Payer: Self-pay | Admitting: Nurse Practitioner

## 2024-03-02 ENCOUNTER — Other Ambulatory Visit

## 2024-03-02 DIAGNOSIS — I10 Essential (primary) hypertension: Secondary | ICD-10-CM | POA: Diagnosis not present

## 2024-03-02 LAB — BASIC METABOLIC PANEL WITH GFR
BUN: 25 mg/dL — ABNORMAL HIGH (ref 6–23)
CO2: 27 meq/L (ref 19–32)
Calcium: 9.4 mg/dL (ref 8.4–10.5)
Chloride: 104 meq/L (ref 96–112)
Creatinine, Ser: 0.96 mg/dL (ref 0.40–1.50)
GFR: 74.26 mL/min (ref >60.00)
Glucose, Bld: 106 mg/dL — ABNORMAL HIGH (ref 70–99)
Potassium: 4.3 meq/L (ref 3.5–5.1)
Sodium: 140 meq/L (ref 135–145)

## 2024-03-06 ENCOUNTER — Ambulatory Visit (INDEPENDENT_AMBULATORY_CARE_PROVIDER_SITE_OTHER): Admitting: Neurology

## 2024-03-06 ENCOUNTER — Other Ambulatory Visit: Payer: Self-pay | Admitting: Nurse Practitioner

## 2024-03-06 DIAGNOSIS — R001 Bradycardia, unspecified: Secondary | ICD-10-CM

## 2024-03-06 DIAGNOSIS — E66811 Obesity, class 1: Secondary | ICD-10-CM

## 2024-03-06 DIAGNOSIS — R635 Abnormal weight gain: Secondary | ICD-10-CM

## 2024-03-06 DIAGNOSIS — G4731 Primary central sleep apnea: Secondary | ICD-10-CM

## 2024-03-06 DIAGNOSIS — G4733 Obstructive sleep apnea (adult) (pediatric): Secondary | ICD-10-CM | POA: Diagnosis not present

## 2024-03-06 DIAGNOSIS — G4734 Idiopathic sleep related nonobstructive alveolar hypoventilation: Secondary | ICD-10-CM

## 2024-03-07 NOTE — Progress Notes (Signed)
 See procedure note.

## 2024-03-08 NOTE — Telephone Encounter (Signed)
 Called pt stated that he had receive a called but was not able to pick before it disconnected. But did let pt know it was about his lab result, made him aware of this.

## 2024-03-09 NOTE — Addendum Note (Signed)
 Addended by: Rylah Fukuda on: 03/09/2024 01:24 PM   Modules accepted: Orders

## 2024-03-09 NOTE — Procedures (Signed)
 [facility]  HOME SLEEP TEST (Watch PAT) REPORT  STUDY DATE: 03/06/2024  DOB: 1941-01-15  MRN: [id number]  ORDERING CLINICIAN: Debbra Fairy, MD, PhD   REFERRING CLINICIAN: Zorita Hiss, NP   CLINICAL INFORMATION/HISTORY: 83 y.o.-year-old male with an underlying medical history of AAA, arthritis, status post right total knee replacement, status post partial left knee replacement, asthma, eczema, gout, coronary artery disease, hypertension, hyperlipidemia, macular degeneration, cataracts, history of kidney stones and, and obesity, who was previously diagnosed with obstructive sleep apnea and placed on PAP therapy.  Is currently on CPAP of 13 cm with with excellent compliance, mildly elevated residual AHI.  He should be eligible for a new machine.   Epworth sleepiness score: 6/24.  BMI: 32.7 kg/m  FINDINGS:   Sleep Summary:   Total Recording Time (hours, min): 7 hours, 41 min  Total Sleep Time (hours, min):  5 hours, 49 min  Percent REM (%):    0%   Respiratory Indices:   Calculated pAHI (per hour):  45.6/hour         REM pAHI:    N/A       NREM pAHI: 45.6/hour  Central pAHI: 27.6/hour  Oxygen Saturation Statistics:    Oxygen Saturation (%) Mean: 92%   Minimum oxygen saturation (%):                 81%   O2 Saturation Range (%): 81 -98%    O2 Saturation (minutes) <=88%: 12.2 min  Pulse Rate Statistics:   Pulse Mean (bpm):    45/min    Pulse Range (34-70/min)   IMPRESSION:   OSA (obstructive sleep apnea), severe Central Sleep Apnea  Nocturnal Hypoxemia  RECOMMENDATION:  This home sleep test demonstrates severe obstructive sleep apnea with a total AHI of 45.6/hour and O2 nadir of 81% with significant time below or at 88% saturation of over 10 minutes for the study, indicating nocturnal hypoxemia.  There was a moderate central sleep apnea component as well.  Snoring was detected, in the mild to loud range.  Ongoing treatment with positive  airway pressure is highly recommended. The patient will be advised to proceed with an autoPAP titration/trial at home, pressure of 8 to 14 cm with EPR of 3. A laboratory attended titration study can be considered in the future for optimization of treatment settings and to improve tolerance and compliance, if needed, down the road. Alternative treatment options are limited secondary to the severity of the patient's sleep disordered breathing, but may include surgical treatment with an implantable hypoglossal nerve stimulator (in carefully selected candidates, meeting criteria).  Concomitant weight loss is recommended (where clinically appropriate). Please note, that untreated obstructive sleep apnea may carry additional perioperative morbidity. Patients with significant obstructive sleep apnea should receive perioperative PAP therapy and the surgeons and particularly the anesthesiologist should be informed of the diagnosis and the severity of the sleep disordered breathing. The patient should be cautioned not to drive, work at heights, or operate dangerous or heavy equipment when tired or sleepy. Review and reiteration of good sleep hygiene measures should be pursued with any patient. Other causes of the patient's symptoms, including circadian rhythm disturbances, an underlying mood disorder, medication effect and/or an underlying medical problem cannot be ruled out based on this test. Clinical correlation is recommended.  The patient and his referring provider will be notified of the test results. The patient will be seen in follow up in sleep clinic at [facility].  I certify that I have  reviewed the raw data recording prior to the issuance of this report in accordance with the standards of the American Academy of Sleep Medicine (AASM).    INTERPRETING PHYSICIAN:   Debbra Fairy, MD, PhD Medical Director, [facility]) Diplomat, ABPN (Neurology and Sleep)   [facility] [address] [place], [place] [address] [phone]

## 2024-03-13 ENCOUNTER — Telehealth: Payer: Self-pay | Admitting: Nurse Practitioner

## 2024-03-13 NOTE — Telephone Encounter (Signed)
 Copied from CRM [id number]. Topic: Clinical - Medication Question >> Mar 13, 2024  1:40 PM Traseandia E wrote: Reason for CRM: PT calling in regards to how long should cardiac monitor to be worn advise to [facility] for one week ,call dropped tried call pt back not able to hear me

## 2024-03-13 NOTE — Telephone Encounter (Signed)
 Copied from CRM [id number]. Topic: Clinical - Medication Question >> Mar 13, 2024  1:57 PM Lonzell Robin C wrote: Reason for CRM: patient's call got disconnected and called back about his cardiac monitor. In the notes Nurse Del Favia told him to contact his Cardiologist but patient stated that NP Adella Agee prescribed him the monitor and wants to know why he has to contact his Cardiologist. I called [facility] and was told that RN Del Favia in not located in office. Please contact patient regarding this matter.

## 2024-03-13 NOTE — Telephone Encounter (Signed)
 Copied from CRM [id number]. Topic: Clinical - Medication Question >> Mar 13, 2024 11:21 AM Shereese L wrote: Reason for CRM: patient stated that he has a cardiac monitor and wanted to know the length of time that he's suppose to wear it. Patient needs a call back stating this info

## 2024-03-15 ENCOUNTER — Other Ambulatory Visit: Payer: Self-pay | Admitting: Nurse Practitioner

## 2024-03-15 DIAGNOSIS — I10 Essential (primary) hypertension: Secondary | ICD-10-CM

## 2024-03-15 MED ORDER — TELMISARTAN 40 MG PO TABS: 40.0000 mg | ORAL_TABLET | Freq: Every day | ORAL | 1 refills | Status: DC

## 2024-03-15 NOTE — Telephone Encounter (Signed)
 Called pt unable to leave voicemail.

## 2024-03-19 ENCOUNTER — Telehealth: Payer: Self-pay | Admitting: *Deleted

## 2024-03-19 NOTE — Telephone Encounter (Signed)
 Spoke with patient and discussed his sleep study results. The patient is agreeable to autopap setup for new machine. He wants to use [facility]. We discussed the insurance compliance requirements which includes using the machine at least 4 hours at night and also being seen by our office for initial follow-up between 30 and 90 days after setup. Pt was scheduled for 8/11 at 9 am arrival 845. Pt verbalized appreciation for the call.   Referral sent to [facility]. Report sent to referring provider.

## 2024-03-19 NOTE — Telephone Encounter (Signed)
-----   Message from Debbra Fairy sent at 03/09/2024  1:24 PM EDT ----- Patient referred by PCP, seen by me on 02/02/2024 for reevaluation of his prior diagnosis of OSA, he is currently on CPAP of 13 cm but should qualify for new machine.  Patient had a HST on 03/06/2024.    Please call and notify the patient that the recent home sleep test showed obstructive sleep apnea in the severe range. I would like to prescribe a new machine, we can start him on an AutoPap setting with a range of pressures close to what he has been on before.  Please ask about DME preference.  The DME representative will fit the patient with a mask of choice, educate him on how to use the machine, how to put the mask on, etc. I have placed an order in the chart. Please send the order to a local DME, talk to patient, send report to referring MD. Please also reinforce the need for compliance with treatment. We will need a FU in sleep clinic for 10 weeks post-PAP set up, please arrange that with me or one of our NPs. Thanks,   Debbra Fairy, MD, PhD [facility])

## 2024-03-20 NOTE — Telephone Encounter (Signed)
 New, Maryella Shivers, Otilio Jefferson, RN; Alain Honey; Jeris Penta, [place]; 1 other Received, thank you!

## 2024-03-30 ENCOUNTER — Ambulatory Visit: Admitting: Nurse Practitioner

## 2024-03-30 ENCOUNTER — Other Ambulatory Visit: Payer: Self-pay | Admitting: Nurse Practitioner

## 2024-03-30 DIAGNOSIS — I700 Atherosclerosis of aorta: Secondary | ICD-10-CM

## 2024-03-30 DIAGNOSIS — I10 Essential (primary) hypertension: Secondary | ICD-10-CM

## 2024-04-04 NOTE — Telephone Encounter (Signed)
 Spoke pt, pt stated that he waited on it and will be following up with his Cardiologist

## 2024-04-12 ENCOUNTER — Ambulatory Visit: Admitting: Nurse Practitioner

## 2024-04-14 ENCOUNTER — Other Ambulatory Visit: Payer: Self-pay | Admitting: Nurse Practitioner

## 2024-04-14 DIAGNOSIS — I10 Essential (primary) hypertension: Secondary | ICD-10-CM

## 2024-05-31 ENCOUNTER — Ambulatory Visit: Admitting: Nurse Practitioner

## 2024-06-04 ENCOUNTER — Telehealth: Payer: Self-pay | Admitting: Nurse Practitioner

## 2024-06-04 NOTE — Telephone Encounter (Unsigned)
 Copied from CRM [id number]. Topic: Clinical - Prescription Issue >> Jun 04, 2024 10:26 AM Carlatta H wrote: Reason for CRM: Please call [phone]//Stated prescription was not signed or dated

## 2024-06-06 NOTE — Progress Notes (Deleted)
 PATIENT: Grant Edwards DOB: 25-Aug-1941  REASON FOR VISIT: follow up HISTORY FROM: patient  No chief complaint on file.    HISTORY OF PRESENT ILLNESS:  06/06/24 ALL:  Grant Edwards is a 83 y.o. male here today for follow up for OSA on CPAP.  He was seen in consult with Dr Buck 01/2024 and needing a new machine. HST 02/2024 showed severe obstructive sleep apnea with a total AHI of 45.6/hour and O2 nadir of 81% with significant time below or at 88% saturation of over 10 minutes for the study, indicating nocturnal hypoxemia. New AutoPAP advised. Since,     HISTORY: (copied from Dr Obie previous note)  Dear Grant Edwards,    I saw your patient, Grant Edwards, upon your kind request in my sleep clinic today for initial consultation of his sleep disorder, in particular, evaluation of his prior diagnosis of obstructive sleep apnea. The patient is unaccompanied today. As you know, [name] is a very pleasant 83 y.o.-year-old male with an underlying medical history of AAA, arthritis, status post right total knee replacement, status post partial left knee replacement, asthma, eczema, gout, coronary artery disease, hypertension, hyperlipidemia, macular degeneration, cataracts, history of kidney stones and, and obesity, who was diagnosed with obstructive sleep apnea in 1996 originally.  He has been on CPAP therapy for years, he is on a ResMed air sense 10 AutoSet machine currently.  His Epworth sleepiness score is 6 out of 24, fatigue severity score is 10 out of 63. He estimates that this machine could be about 81 years old.  He would like to get a new machine.  He is fully compliant with treatment and has benefited from treatment over the course of years.  He has been 100% compliant with his CPAP, I reviewed his 90-day compliance data between 11/04/2023 and 02/01/2023.  He uses machine every night with an average usage of 7 hours and 10 minutes, residual AHI slightly elevated at 8/h, leak on the higher  side with significant fluctuation noted, 95th percentile at 32.3 L/min on a pressure of 13 cm without EPR.  He uses a large ResMed AirTouch full facemask.  His supplier is [facility].  He is separated, he lives alone.  He has a significant other.  He has 3 grown children.  He is a non-smoker essentially.  He smoked for less than 5 years and stopped at age 89, he was not a pack per day smoker.  He has a small dog in the household.  He has plugged in with the [facility] in [place] but would like to get his sleep testing and sleep apnea machine through his insurance.  He has had some interim weight gain in the past year.  His bedtime is generally between 7:30 PM and 8 PM and rise time is around 5 AM.  He does not drink any alcohol.   REVIEW OF SYSTEMS: Out of a complete 14 system review of symptoms, the patient complains only of the following symptoms, and all other reviewed systems are negative.  ESS:  ALLERGIES: Allergies  Allergen Reactions   Aspirin Other (See Comments) and Shortness Of Breath    asthma   Azithromycin Rash   Nsaids Shortness Of Breath    Renal impact   Erythromycin Rash    HOME MEDICATIONS: Outpatient Medications Prior to Visit  Medication Sig Dispense Refill   acetaminophen  (TYLENOL ) 650 MG CR tablet Take 650 mg by mouth every 6 (six) hours as needed for pain.  albuterol  (VENTOLIN  HFA) 108 (90 Base) MCG/ACT inhaler Inhale 2 puffs into the lungs every 4 (four) hours as needed for wheezing or shortness of breath. 1 Inhaler 1   allopurinol  (ZYLOPRIM ) 300 MG tablet TAKE 1 TABLET BY MOUTH EVERY DAY IN THE MORNING 90 tablet 0   Ascorbic Acid (VITAMIN C PO) Take 900 mg by mouth daily.     atorvastatin  (LIPITOR) 20 MG tablet Take 20 mg by mouth in the morning.     Azelastine HCl 137 MCG/SPRAY SOLN Place 1 spray into both nostrils daily as needed (congestion).     Cholecalciferol (VITAMIN D3) 250 MCG (10000 UT) capsule Take 10,000 Units by mouth every 3 (three) days.      clobetasol cream (TEMOVATE) 0.05 % Apply 1 application. topically daily as needed (break outs).     colchicine 0.6 MG tablet Take 0.6-1.2 mg by mouth daily as needed (gout flares.).     COSOPT 2-0.5 % ophthalmic solution Place 1 drop into the right eye 2 (two) times daily.     Evolocumab (REPATHA) 140 MG/ML SOSY Inject into the skin. Every 2-3 weeks     ezetimibe  (ZETIA ) 10 MG tablet TAKE 1 TABLET BY MOUTH EVERY DAY 90 tablet 0   Faricimab-svoa (VABYSMO IZ) by Intravitreal route.     fluticasone  (FLONASE ) 50 MCG/ACT nasal spray Place 1 spray into both nostrils at bedtime.     furosemide  (LASIX ) 20 MG tablet Take 20 mg by mouth in the morning.     metoprolol  succinate (TOPROL -XL) 25 MG 24 hr tablet Take 0.5 tablets (12.5 mg total) by mouth in the morning. 90 tablet 1   Multiple Vitamins-Minerals (ZINC PO) Take 50 mg by mouth daily.     telmisartan  (MICARDIS ) 40 MG tablet TAKE 1 TABLET BY MOUTH EVERY DAY 30 tablet 1   vitamin E 180 MG (400 UNITS) capsule Take 400 Units by mouth daily.     No facility-administered medications prior to visit.    PAST MEDICAL HISTORY: Past Medical History:  Diagnosis Date   AAA (abdominal aortic aneurysm) (HCC)    abdominal and Acending Aortic    Arthritis    Asthma    PRN inhaler   Claustrophobia    Coronary artery disease    Eczema    Gout    History of kidney stones    x1   Hx of adenomatous colonic polyps    Hyperlipidemia    Hypertension    Macular degeneration    Pneumonia    83 years old   Sleep apnea    CPAP  13 setting    PAST SURGICAL HISTORY: Past Surgical History:  Procedure Laterality Date   COLONOSCOPY W/ POLYPECTOMY  last 2015   multiple   knee intercompartmental replacement  2008   Dr. Jane    POLYPECTOMY     RETINAL LASER PROCEDURE  2010   RHINOPLASTY  1976   TONSILLECTOMY     TOTAL KNEE ARTHROPLASTY Right 02/22/2022   Procedure: TOTAL KNEE ARTHROPLASTY;  Surgeon: Melodi Lerner, MD;  Location: [facility];  Service:  Orthopedics;  Laterality: Right;    FAMILY HISTORY: Family History  Problem Relation Age of Onset   Cancer Mother    Allergic rhinitis Mother    Breast cancer Mother    Heart disease Father    Heart attack Father    Allergic rhinitis Son    Angioedema Neg Hx    Asthma Neg Hx    Eczema Neg Hx    Immunodeficiency  Neg Hx    Urticaria Neg Hx    Colon cancer Neg Hx    Colon polyps Neg Hx    Esophageal cancer Neg Hx    Rectal cancer Neg Hx    Stomach cancer Neg Hx    Sleep apnea Neg Hx     SOCIAL HISTORY: Social History   Socioeconomic History   Marital status: Legally Separated    Spouse name: Not on file   Number of children: 3   Years of education: Not on file   Highest education level: Not on file  Occupational History   Occupation: Retired Charity fundraiser  Tobacco Use   Smoking status: Never   Smokeless tobacco: Never  Vaping Use   Vaping status: Never Used  Substance and Sexual Activity   Alcohol use: No    Alcohol/week: 0.0 standard drinks of alcohol   Drug use: No   Sexual activity: Not Currently  Other Topics Concern   Not on file  Social History Narrative   Lives alone and one dog.   Has a significant other who he met at church    Right handed    Caffeine: 1 coffee in the morning   Social Drivers of Health   Financial Resource Strain: Low Risk  (12/23/2023)   [facility]   Overall Financial Resource Strain (CARDIA)    Difficulty of Paying Living Expenses: Not hard at all  Food Insecurity: No Food Insecurity (12/23/2023)   Received from [facility]   Hunger Vital Sign    Within the past 12 months, you worried that your food would run out before you got the money to buy more.: Never true    Within the past 12 months, the food you bought just didn't last and you didn't have money to get more.: Never true  Transportation Needs: No Transportation Needs (12/23/2023)   Received from [facility] - Transportation    Lack of Transportation  (Medical): No    Lack of Transportation (Non-Medical): No  Physical Activity: Inactive (08/22/2023)   Exercise Vital Sign    Days of Exercise per Week: 0 days    Minutes of Exercise per Session: 0 min  Stress: Stress Concern Present (08/22/2023)   [employer] of Occupational Health - Occupational Stress Questionnaire    Feeling of Stress : To some extent  Social Connections: Moderately Integrated (08/22/2023)   Social Connection and Isolation Panel    Frequency of Communication with Friends and Family: More than three times a week    Frequency of Social Gatherings with Friends and Family: Twice a week    Attends Religious Services: More than 4 times per year    Active Member of [employer] or Organizations: Yes    Attends Banker Meetings: More than 4 times per year    Marital Status: Separated  Intimate Partner Violence: Patient Unable To Answer (08/22/2023)   Humiliation, Afraid, Rape, and Kick questionnaire    Fear of Current or Ex-Partner: Patient unable to answer    Emotionally Abused: Patient unable to answer    Physically Abused: Patient unable to answer    Sexually Abused: Patient unable to answer     PHYSICAL EXAM  There were no vitals filed for this visit. There is no height or weight on file to calculate BMI.  Generalized: Well developed, in no acute distress  Cardiology: normal rate and rhythm, no murmur noted Respiratory: clear to auscultation bilaterally  Neurological examination  Mentation: Alert oriented  to time, place, history taking. Follows all commands speech and language fluent Cranial nerve II-XII: Pupils were equal round reactive to light. Extraocular movements were full, visual field were full  Motor: The motor testing reveals 5 over 5 strength of all 4 extremities. Good symmetric motor tone is noted throughout.  Gait and station: Gait is normal.    DIAGNOSTIC DATA (LABS, IMAGING, TESTING) - I reviewed patient records, labs, notes,  testing and imaging myself where available.      No data to display           Lab Results  Component Value Date   WBC 6.5 09/16/2023   HGB 14.0 09/16/2023   HCT 42.6 09/16/2023   MCV 89.5 09/16/2023   PLT 177.0 09/16/2023      Component Value Date/Time   NA 140 03/02/2024 0757   K 4.3 03/02/2024 0757   CL 104 03/02/2024 0757   CO2 27 03/02/2024 0757   GLUCOSE 106 (H) 03/02/2024 0757   BUN 25 (H) 03/02/2024 0757   CREATININE 0.96 03/02/2024 0757   CALCIUM  9.4 03/02/2024 0757   PROT 6.6 09/16/2023 1125   ALBUMIN 4.1 09/16/2023 1125   AST 15 09/16/2023 1125   ALT 9 09/16/2023 1125   ALKPHOS 80 09/16/2023 1125   BILITOT 1.1 09/16/2023 1125   GFRNONAA >60 03/05/2022 1937   GFRAA  04/19/2008 0610    >60        The eGFR has been calculated using the MDRD equation. This calculation has not been validated in all clinical   Lab Results  Component Value Date   CHOL 107 09/16/2023   HDL 39.60 09/16/2023   LDLCALC 44 09/16/2023   TRIG 114.0 09/16/2023   CHOLHDL 3 09/16/2023   Lab Results  Component Value Date   HGBA1C 6.2 09/16/2023   No results found for: VITAMINB12 Lab Results  Component Value Date   TSH 1.10 09/16/2023     ASSESSMENT AND PLAN 83 y.o. year old male  has a past medical history of AAA (abdominal aortic aneurysm) (HCC), Arthritis, Asthma, Claustrophobia, Coronary artery disease, Eczema, Gout, History of kidney stones, adenomatous colonic polyps, Hyperlipidemia, Hypertension, Macular degeneration, Pneumonia, and Sleep apnea. here with   No diagnosis found.    Ozie Lupe is doing well on CPAP therapy. Compliance report reveals ***. *** was encouraged to continue using CPAP nightly and for greater than 4 hours each night. We will update supply orders as indicated. Risks of untreated sleep apnea review and education materials provided. Healthy lifestyle habits encouraged. *** will follow up in ***, sooner if needed. *** verbalizes understanding  and agreement with this plan.    No orders of the defined types were placed in this encounter.    No orders of the defined types were placed in this encounter.     Greig Forbes, FNP-C 06/06/2024, 4:19 PM [facility] [address], [address] [place], [place] [address] [phone]

## 2024-06-06 NOTE — Patient Instructions (Incomplete)

## 2024-06-07 NOTE — Telephone Encounter (Signed)
 Called [facility] and explain the reason why different provider provide the signature as pt PCP is on leave still september . New forms will be signed and refax

## 2024-06-13 ENCOUNTER — Telehealth: Payer: Self-pay | Admitting: Neurology

## 2024-06-13 NOTE — Telephone Encounter (Signed)
 Patient reschedule appointment due to death in the family. Reschedule with Harlene, NP due CPAP compliance.

## 2024-06-13 NOTE — Progress Notes (Unsigned)
 [facility] [address] [place]. [place] [address] [phone]       OFFICE FOLLOW UP NOTE  [name] Date of Birth:  1941/10/06 Medical Record Number:  [id number]    Primary neurologist: Dr. Buck Reason for visit: Initial CPAP follow-up    SUBJECTIVE:   CHIEF COMPLAINT:  No chief complaint on file.   Follow-up visit:  Prior visit: 02/02/2024 with Dr. Buck  Brief HPI:   Grant Edwards is a 83 y.o. male who was evaluated by Dr. Buck in 01/2024 with long-term history of OSA on CPAP therapy, initially diagnosed in 1996.  At initial visit, estimated current CPAP machine at least 81 years old and interested in pursuing new machine.  HST 02/2024 showed severe OSA with a total AHI of 45.6/hour and O2 nadir of 81% with significant time below or at 88% saturation of over 10 minutes for the study, indicating nocturnal hypoxemia.  Also noted moderate central apnea component.  Set up with new AutoPap machine 04/2024.       Interval history:        ROS:   14 system review of systems performed and negative with exception of those listed in HPI  PMH:  Past Medical History:  Diagnosis Date   AAA (abdominal aortic aneurysm) (HCC)    abdominal and Acending Aortic    Arthritis    Asthma    PRN inhaler   Claustrophobia    Coronary artery disease    Eczema    Gout    History of kidney stones    x1   Hx of adenomatous colonic polyps    Hyperlipidemia    Hypertension    Macular degeneration    Pneumonia    83 years old   Sleep apnea    CPAP  13 setting    PSH:  Past Surgical History:  Procedure Laterality Date   COLONOSCOPY W/ POLYPECTOMY  last 2015   multiple   knee intercompartmental replacement  2008   Dr. Jane    POLYPECTOMY     RETINAL LASER PROCEDURE  2010   RHINOPLASTY  1976   TONSILLECTOMY     TOTAL KNEE ARTHROPLASTY Right 02/22/2022   Procedure: TOTAL KNEE ARTHROPLASTY;  Surgeon: Melodi Lerner, MD;  Location: [facility]  [facility];  Service: Orthopedics;  Laterality: Right;    Social History:  Social History   Socioeconomic History   Marital status: Legally Separated    Spouse name: Not on file   Number of children: 3   Years of education: Not on file   Highest education level: Not on file  Occupational History   Occupation: Retired Charity fundraiser  Tobacco Use   Smoking status: Never   Smokeless tobacco: Never  Vaping Use   Vaping status: Never Used  Substance and Sexual Activity   Alcohol use: No    Alcohol/week: 0.0 standard drinks of alcohol   Drug use: No   Sexual activity: Not Currently  Other Topics Concern   Not on file  Social History Narrative   Lives alone and one dog.   Has a significant other who he met at church    Right handed    Caffeine: 1 coffee in the morning   Social Drivers of Health   Financial Resource Strain: Low Risk  (12/23/2023)   Received from [facility]   Overall Financial Resource Strain (CARDIA)    Difficulty of Paying Living Expenses: Not hard at all  Food Insecurity: No Food Insecurity (12/23/2023)  Received from [facility]   Hunger Vital Sign    Within the past 12 months, you worried that your food would run out before you got the money to buy more.: Never true    Within the past 12 months, the food you bought just didn't last and you didn't have money to get more.: Never true  Transportation Needs: No Transportation Needs (12/23/2023)   Received from [facility]   PRAPARE - Transportation    Lack of Transportation (Medical): No    Lack of Transportation (Non-Medical): No  Physical Activity: Inactive (08/22/2023)   Exercise Vital Sign    Days of Exercise per Week: 0 days    Minutes of Exercise per Session: 0 min  Stress: Stress Concern Present (08/22/2023)   [employer] of Occupational Health - Occupational Stress Questionnaire    Feeling of Stress : To some extent  Social Connections: Moderately Integrated (08/22/2023)   Social Connection and  Isolation Panel    Frequency of Communication with Friends and Family: More than three times a week    Frequency of Social Gatherings with Friends and Family: Twice a week    Attends Religious Services: More than 4 times per year    Active Member of [employer] or Organizations: Yes    Attends Banker Meetings: More than 4 times per year    Marital Status: Separated  Intimate Partner Violence: Patient Unable To Answer (08/22/2023)   Humiliation, Afraid, Rape, and Kick questionnaire    Fear of Current or Ex-Partner: Patient unable to answer    Emotionally Abused: Patient unable to answer    Physically Abused: Patient unable to answer    Sexually Abused: Patient unable to answer    Family History:  Family History  Problem Relation Age of Onset   Cancer Mother    Allergic rhinitis Mother    Breast cancer Mother    Heart disease Father    Heart attack Father    Allergic rhinitis Son    Angioedema Neg Hx    Asthma Neg Hx    Eczema Neg Hx    Immunodeficiency Neg Hx    Urticaria Neg Hx    Colon cancer Neg Hx    Colon polyps Neg Hx    Esophageal cancer Neg Hx    Rectal cancer Neg Hx    Stomach cancer Neg Hx    Sleep apnea Neg Hx     Medications:   Current Outpatient Medications on File Prior to Visit  Medication Sig Dispense Refill   acetaminophen  (TYLENOL ) 650 MG CR tablet Take 650 mg by mouth every 6 (six) hours as needed for pain.     albuterol  (VENTOLIN  HFA) 108 (90 Base) MCG/ACT inhaler Inhale 2 puffs into the lungs every 4 (four) hours as needed for wheezing or shortness of breath. 1 Inhaler 1   allopurinol  (ZYLOPRIM ) 300 MG tablet TAKE 1 TABLET BY MOUTH EVERY DAY IN THE MORNING 90 tablet 0   Ascorbic Acid (VITAMIN C PO) Take 900 mg by mouth daily.     atorvastatin  (LIPITOR) 20 MG tablet Take 20 mg by mouth in the morning.     Azelastine HCl 137 MCG/SPRAY SOLN Place 1 spray into both nostrils daily as needed (congestion).     Cholecalciferol (VITAMIN D3) 250 MCG  (10000 UT) capsule Take 10,000 Units by mouth every 3 (three) days.     clobetasol cream (TEMOVATE) 0.05 % Apply 1 application. topically daily as needed (break outs).  colchicine 0.6 MG tablet Take 0.6-1.2 mg by mouth daily as needed (gout flares.).     COSOPT 2-0.5 % ophthalmic solution Place 1 drop into the right eye 2 (two) times daily.     Evolocumab (REPATHA) 140 MG/ML SOSY Inject into the skin. Every 2-3 weeks     ezetimibe  (ZETIA ) 10 MG tablet TAKE 1 TABLET BY MOUTH EVERY DAY 90 tablet 0   Faricimab-svoa (VABYSMO IZ) by Intravitreal route.     fluticasone  (FLONASE ) 50 MCG/ACT nasal spray Place 1 spray into both nostrils at bedtime.     furosemide  (LASIX ) 20 MG tablet Take 20 mg by mouth in the morning.     metoprolol  succinate (TOPROL -XL) 25 MG 24 hr tablet Take 0.5 tablets (12.5 mg total) by mouth in the morning. 90 tablet 1   Multiple Vitamins-Minerals (ZINC PO) Take 50 mg by mouth daily.     telmisartan  (MICARDIS ) 40 MG tablet TAKE 1 TABLET BY MOUTH EVERY DAY 30 tablet 1   vitamin E 180 MG (400 UNITS) capsule Take 400 Units by mouth daily.     No current facility-administered medications on file prior to visit.    Allergies:   Allergies  Allergen Reactions   Aspirin Other (See Comments) and Shortness Of Breath    asthma   Azithromycin Rash   Nsaids Shortness Of Breath    Renal impact   Erythromycin Rash      OBJECTIVE:  Physical Exam  There were no vitals filed for this visit. There is no height or weight on file to calculate BMI. No results found.   General: well developed, well nourished, seated, in no evident distress Head: head normocephalic and atraumatic.   Neck: supple with no carotid or supraclavicular bruits Cardiovascular: regular rate and rhythm, no murmurs  Neurologic Exam Mental Status: Awake and fully alert. Oriented to place and time. Recent and remote memory intact. Attention span, concentration and fund of knowledge appropriate. Mood and  affect appropriate.  Cranial Nerves: Pupils equal, briskly reactive to light. Extraocular movements full without nystagmus. Visual fields full to confrontation. Hearing intact. Facial sensation intact. Face, tongue, palate moves normally and symmetrically.  Motor: Normal bulk and tone. Normal strength in all tested extremity muscles Gait and Station: Arises from chair without difficulty. Stance is normal. Gait demonstrates normal stride length and balance without use of AD.         ASSESSMENT/PLAN: Grant Edwards is a 83 y.o. year old male    OSA on CPAP :  Compliance report shows satisfactory usage with optimal residual AHI.   Continue current pressure settings 8-14 with EPR 3 Discussed continued nightly usage with ensuring greater than 4 hours nightly for optimal benefit and per insurance purposes.   DME [employer], continue to follow with DME company for any needed supplies or CPAP related concerns CPAP set up 04/2024     Follow up in *** or call earlier if needed   CC:  PCP: Elnor [name] BRAVO, NP    I personally spent a total of *** minutes in the care of the patient today including {Time Based Coding:210964241}.     Harlene Bogaert, AGNP-BC  [facility] [address] [place], [place] [address]  Phone [phone] Fax [phone] Note: This document was prepared with digital dictation and possible smart phrase technology. Any transcriptional errors that result from this process are unintentional.

## 2024-06-14 ENCOUNTER — Encounter: Payer: Self-pay | Admitting: Adult Health

## 2024-06-14 ENCOUNTER — Telehealth: Payer: Self-pay | Admitting: Nurse Practitioner

## 2024-06-14 ENCOUNTER — Ambulatory Visit (INDEPENDENT_AMBULATORY_CARE_PROVIDER_SITE_OTHER): Admitting: Adult Health

## 2024-06-14 VITALS — BP 138/80 | HR 52 | Ht 71.0 in | Wt 242.0 lb

## 2024-06-14 DIAGNOSIS — G4733 Obstructive sleep apnea (adult) (pediatric): Secondary | ICD-10-CM

## 2024-06-14 DIAGNOSIS — I10 Essential (primary) hypertension: Secondary | ICD-10-CM

## 2024-06-14 MED ORDER — TELMISARTAN 40 MG PO TABS: 40.0000 mg | ORAL_TABLET | Freq: Every day | ORAL | 1 refills | Status: DC

## 2024-06-14 NOTE — Patient Instructions (Signed)
 Your Plan:  Continue nightly use of CPAP for adequate sleep apnea management  Will adjust pressures from 8-14 to 8-16   Continue to follow with DME [employer] for any needed supplies or CPAP related concerns     Follow-up in 1 year or call earlier if needed     Thank you for coming to see us  at [facility]. I hope we have been able to provide you high quality care today.  You may receive a patient satisfaction survey over the next few weeks. We would appreciate your feedback and comments so that we may continue to improve ourselves and the health of our patients.

## 2024-06-14 NOTE — Telephone Encounter (Signed)
 Prescription Request  06/14/2024  LOV: 02/29/2024  What is the name of the medication or equipment? telmisartan  (MICARDIS ) 40 MG tablet   Have you contacted your pharmacy to request a refill? Yes   Which pharmacy would you like this sent to?  [id number] - [place], [place] - [address] [place] [address] Phone: [phone] Fax: [phone]    Patient notified that their request is being sent to the clinical staff for review and that they should receive a response within 2 business days.   Please advise at [facility] [phone]

## 2024-06-14 NOTE — Addendum Note (Signed)
 Addended by: LEAR, Prajna Vanderpool P on: 06/14/2024 01:32 PM   Modules accepted: Orders

## 2024-06-18 ENCOUNTER — Ambulatory Visit: Admitting: Family Medicine

## 2024-06-18 DIAGNOSIS — G4733 Obstructive sleep apnea (adult) (pediatric): Secondary | ICD-10-CM

## 2024-06-19 ENCOUNTER — Other Ambulatory Visit: Payer: Self-pay | Admitting: Surgery

## 2024-06-19 DIAGNOSIS — I7121 Aneurysm of the ascending aorta, without rupture: Secondary | ICD-10-CM

## 2024-07-25 ENCOUNTER — Ambulatory Visit

## 2024-08-02 ENCOUNTER — Ambulatory Visit: Admitting: Nurse Practitioner

## 2024-08-23 ENCOUNTER — Telehealth: Payer: Self-pay | Admitting: Nurse Practitioner

## 2024-08-23 NOTE — Telephone Encounter (Signed)
 Please call patient and let him know that he is due for his annual CT scan to monitor his aortic aneurysm. Scan was ordered by Dr. Lucas. If the patient can get this scheduled that would be recommended, if I need to reorder the scan then I can.

## 2024-08-24 ENCOUNTER — Ambulatory Visit: Admitting: Nurse Practitioner

## 2024-08-24 VITALS — BP 132/74 | HR 82 | Temp 97.6°F | Ht 71.0 in | Wt 240.2 lb

## 2024-08-24 DIAGNOSIS — R7303 Prediabetes: Secondary | ICD-10-CM | POA: Insufficient documentation

## 2024-08-24 DIAGNOSIS — F40240 Claustrophobia: Secondary | ICD-10-CM | POA: Diagnosis not present

## 2024-08-24 DIAGNOSIS — I7121 Aneurysm of the ascending aorta, without rupture: Secondary | ICD-10-CM

## 2024-08-24 DIAGNOSIS — I495 Sick sinus syndrome: Secondary | ICD-10-CM | POA: Insufficient documentation

## 2024-08-24 DIAGNOSIS — I700 Atherosclerosis of aorta: Secondary | ICD-10-CM

## 2024-08-24 DIAGNOSIS — Z23 Encounter for immunization: Secondary | ICD-10-CM

## 2024-08-24 DIAGNOSIS — I10 Essential (primary) hypertension: Secondary | ICD-10-CM

## 2024-08-24 MED ORDER — DIAZEPAM 5 MG PO TABS: 5.0000 mg | ORAL_TABLET | Freq: Once | ORAL | 0 refills | Status: AC

## 2024-08-24 NOTE — Progress Notes (Signed)
 Established Patient Office Visit  Subjective   Patient ID: Grant Edwards, male    DOB: [birth year]  Age: 83 y.o. MRN: [id number]  Chief Complaint  Patient presents with   Prediabetes    Discussed the use of AI scribe software for clinical note transcription with the patient, who gave verbal consent to proceed.  History of Present Illness Givanni Edwards is an 83 year old male who presents for a follow-up visit.  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  - Initial device malfunctioned requiring replacement - New pacemaker placed with perioperative antibiotics - Currently in a six-week recovery period  - Overall reports feeling well  Ascending aortic aneurysm surveillance - Ascending aortic aneurysm last measured at 4 cm in August 2024 - CT scan surveillance is due - Severe claustrophobia with CT scans, managed with diazepam   Dyslipidemia and cardiovascular risk management - Treated with Repatha and Zetia  for hyperlipidemia - Plans for cholesterol testing when fasting  Prediabetes - Hemoglobin A1c measured at 6.2%  Prostate cancer screening - PSA 0.58 1 year ago - Plans to recheck PSA with upcoming lab testing      ROS: see HPI    Objective:     BP 132/74   Pulse 82   Temp 97.6 F (36.4 C) (Temporal)   Ht 5' 11 (1.803 m)   Wt 240 lb 4 oz (109 kg)   SpO2 96%   BMI 33.51 kg/m  BP Readings from Last 3 Encounters:  08/24/24 132/74  06/14/24 138/80  02/29/24 118/80   Wt Readings from Last 3 Encounters:  08/24/24 240 lb 4 oz (109 kg)  06/14/24 242 lb (109.8 kg)  02/22/24 237 lb (107.5 kg)      Physical Exam Vitals reviewed.  Constitutional:      Appearance: Normal appearance.  HENT:     Head: Normocephalic and atraumatic.  Cardiovascular:     Rate and Rhythm: Normal rate and regular rhythm.  Pulmonary:     Effort: Pulmonary effort is normal.     Breath sounds: Normal  breath sounds.  Musculoskeletal:     Cervical back: Neck supple.  Skin:    General: Skin is warm and dry.  Neurological:     Mental Status: He is alert and oriented to person, place, and time.  Psychiatric:        Mood and Affect: Mood normal.        Behavior: Behavior normal.        Thought Content: Thought content normal.        Judgment: Judgment normal.      No results found for any visits on 08/24/24.    The ASCVD Risk score (Arnett DK, et al., 2019) failed to calculate for the following reasons:   The 2019 ASCVD risk score is only valid for ages 35 to 43    Assessment & Plan:   Problem List Items Addressed This Visit       Cardiovascular and Mediastinum   Essential hypertension   Relevant Orders   Lipid panel   Comprehensive metabolic panel with GFR   PSA   Hemoglobin A1c   Aneurysm of ascending aorta without rupture   Ascending aortic aneurysm, stable, under surveillance Aneurysm measures 4 cm, requires annual surveillance. Per patient, CT scan scheduled next week. - Ensure CT scan is completed next week for aneurysm surveillance. - Valium  5mg  tablet to be taken 30-60 minutes prior to procedure sent to pharmacy -  Will be driven by significant other to and from scan      Aortic atherosclerosis   Hyperlipidemia on therapy Managed with Repatha and Zetia . Cholesterol check due within a month. - Order fasting cholesterol test to be completed within the next month.      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 Home monitoring ongoing. - Continue home monitoring of pacemaker function. - Follow-up with electrophysiologist as scheduled        Other   Prediabetes   Prediabetes A1c at 6.2 indicates prediabetes. Dietary modifications discussed to manage blood sugar levels. - Order A1c test to be completed within the  next month. - Advise on low carbohydrate diet focusing on green leafy vegetables, lean proteins, and whole grains. - Plan to repeat A1c in 2-3 months if dietary changes are implemented.      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 Home monitoring ongoing. - Continue home monitoring of pacemaker function. - Follow-up with electrophysiologist as scheduled  Ascending aortic aneurysm, stable, under surveillance Aneurysm measures 4 cm, requires annual surveillance. Per patient, CT scan scheduled next week. - Ensure CT scan is completed next week for aneurysm surveillance. - Valium  5mg  tablet to be taken 30-60 minutes prior to procedure sent to pharmacy - Will be driven by significant other to and from scan  Hyperlipidemia on therapy Managed with Repatha and Zetia . Cholesterol check due within a month. - Order fasting cholesterol test to be completed within the next month.  Prediabetes A1c at 6.2 indicates prediabetes. Dietary modifications discussed to manage blood sugar levels. - Order A1c test to be completed within the next month. - Advise on low carbohydrate diet focusing on green leafy vegetables, lean proteins, and whole grains. - Plan to repeat A1c in 2-3 months if dietary changes are implemented.  Follow-Up PSA test and flu shot due. Plans to return for fasting blood work. - Administer flu shot today. - Schedule PSA test with cholesterol check within the next month. - Return for fasting blood work at [facility], possibly Monday morning.   Return in about 3 months (around 11/24/2024) for F/U with [name].    Grant FORBES Pereyra, NP

## 2024-08-24 NOTE — Patient Instructions (Signed)
 Come back to have your labs checked within the next month.

## 2024-08-24 NOTE — Assessment & Plan Note (Signed)
 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 Home monitoring ongoing. - Continue home monitoring of pacemaker function. - Follow-up with electrophysiologist as scheduled

## 2024-08-24 NOTE — Assessment & Plan Note (Signed)
 Prediabetes A1c at 6.2 indicates prediabetes. Dietary modifications discussed to manage blood sugar levels. - Order A1c test to be completed within the next month. - Advise on low carbohydrate diet focusing on green leafy vegetables, lean proteins, and whole grains. - Plan to repeat A1c in 2-3 months if dietary changes are implemented.

## 2024-08-24 NOTE — Assessment & Plan Note (Signed)
 Ascending aortic aneurysm, stable, under surveillance Aneurysm measures 4 cm, requires annual surveillance. Per patient, CT scan scheduled next week. - Ensure CT scan is completed next week for aneurysm surveillance. - Valium  5mg  tablet to be taken 30-60 minutes prior to procedure sent to pharmacy - Will be driven by significant other to and from scan

## 2024-08-24 NOTE — Addendum Note (Signed)
 Addended by: LEAR, Dason Mosley P on: 08/24/2024 02:30 PM   Modules accepted: Orders

## 2024-08-24 NOTE — Assessment & Plan Note (Signed)
 Hyperlipidemia on therapy Managed with Repatha and Zetia . Cholesterol check due within a month. - Order fasting cholesterol test to be completed within the next month.

## 2024-08-29 ENCOUNTER — Ambulatory Visit

## 2024-08-29 ENCOUNTER — Encounter (HOSPITAL_BASED_OUTPATIENT_CLINIC_OR_DEPARTMENT_OTHER): Payer: Self-pay

## 2024-08-31 ENCOUNTER — Encounter (HOSPITAL_BASED_OUTPATIENT_CLINIC_OR_DEPARTMENT_OTHER): Payer: Self-pay | Admitting: Cardiology

## 2024-08-31 ENCOUNTER — Ambulatory Visit (INDEPENDENT_AMBULATORY_CARE_PROVIDER_SITE_OTHER): Admitting: Cardiology

## 2024-08-31 VITALS — BP 92/68 | HR 74 | Ht 71.0 in | Wt 234.2 lb

## 2024-08-31 DIAGNOSIS — E785 Hyperlipidemia, unspecified: Secondary | ICD-10-CM

## 2024-08-31 DIAGNOSIS — R001 Bradycardia, unspecified: Secondary | ICD-10-CM

## 2024-08-31 DIAGNOSIS — I2510 Atherosclerotic heart disease of native coronary artery without angina pectoris: Secondary | ICD-10-CM | POA: Diagnosis not present

## 2024-08-31 DIAGNOSIS — I7121 Aneurysm of the ascending aorta, without rupture: Secondary | ICD-10-CM | POA: Diagnosis not present

## 2024-08-31 DIAGNOSIS — Z950 Presence of cardiac pacemaker: Secondary | ICD-10-CM

## 2024-08-31 DIAGNOSIS — Z789 Other specified health status: Secondary | ICD-10-CM

## 2024-08-31 DIAGNOSIS — I10 Essential (primary) hypertension: Secondary | ICD-10-CM | POA: Diagnosis not present

## 2024-08-31 DIAGNOSIS — Z955 Presence of coronary angioplasty implant and graft: Secondary | ICD-10-CM

## 2024-08-31 MED ORDER — ASPIRIN 81 MG PO TBEC
81.0000 mg | DELAYED_RELEASE_TABLET | Freq: Every day | ORAL | Status: AC
Start: 2024-08-31 — End: ?

## 2024-08-31 NOTE — Progress Notes (Signed)
 Cardiology Office Note:  .   Date:  08/31/2024  ID:  Grant Edwards, DOB Aug 10, 1941, MRN [id number] PCP: Elnor [name] BRAVO, NP  [facility] Providers Cardiologist:  Shelda Bruckner, MD {  History of Present Illness: .   Grant Edwards is a 83 y.o. male with PMH CAD with prior stenting, bradycardia s/p PPM, hypertension, OSA on CPAP  Referral from 03/30/24 reviewed. No notes included in referral, diagnosis was hypertension and aortic atherosclerosis.   He has been followed by [facility]. After monitor showed bradycardia with rates as low as 28 bpm, he underwent placement of dual chamber pacemaker on 07/19/24. Unfortunately he had lead dislodgement and had to undergo lead revision on 08/01/24.  He has referral in place for ascending aortic aneurysm, last imaged at 4 cm. He is severely claustrophobic and needs to be medicated for scans.  He wishes to transfer all of his care to the [place] system to consolidate his care in one system.  He is overall doing well, does have some sensitivity still at the generator site. Blood pressure is low in the office today. Denies symptoms. He has historically had elevated blood pressure. Has not been checking at home. Takes furosemide  every morning. No recent significant swelling, but does note he had significant LE edema on amlodipine. Has had mild intermittent RLE edema after having knee surgery.   He is not on aspirin, P2Y12, or anticoagulation. He reports that he has an allergy to aspirin (throws him into asthma). Reviewed notes from his stent in 2022 recommending DAPT, was placed on brilinta. He does not recall taking anything after his stents. Has only been on a blood thinner after surgery for his knee in 1991 (has blood clots 2/2 surgery). We discussed trialing aspirin 81 mg daily vs. Using clopidogrel . He has taken aspirin intermittently and hasn't had an asthma attack in years, would prefer to trial this before trying clopidogrel . I  reviewed his notes from [place], confirmed that he has not been on antiplatelet recently. Last I can see is that he was on clopidogrel  in 2023 based on med list, but he does not recall ever taking this.  Has not had any chest pain since stents were placed.   Was on atorvastatin  for a short time, but after being on this for 2-3 days, he couldn't breathe. He believes he tried multiple agents and didn't tolerate any of them. Has been on repatha and tolerated well since. Has worked hard on [employer] and activity as tolerated.  ROS: Denies chest pain, shortness of breath at rest or with normal exertion. No PND, orthopnea, severe LE edema or unexpected weight gain. No syncope or palpitations. ROS otherwise negative except as noted.   Studies Reviewed: SABRA    EKG:  EKG Interpretation Date/Time:  Friday August 31 2024 14:03:14 EDT Ventricular Rate:  73 PR Interval:  280 QRS Duration:  78 QT Interval:  396 QTC Calculation: 436 R Axis:   -13  Text Interpretation: Atrial-paced rhythm with prolonged AV conduction T wave abnormality, consider anterior ischemia Confirmed by Bruckner Shelda [phone]) on 08/31/2024 2:14:55 PM    From Care Everywhere:   Echo 3.11.2024 Adequate 2D M-mode and color-flow Doppler echocardiogram demonstrates  mild concentric left ventricular hypertrophy.  Ejection fraction is normal  with normal left ventricular function.  2.  The aortic, mitral, and tricuspid valves are mildly calcified and  thickened but open adequately.  3.  Mild tricuspid and mitral insufficiency is noted  4.  The atria are  normal in size bilaterally  5.  Right ventricular structure and function is normal  6.  The pericardium is normal in thickness there is no effusion   Cath 06/30/21  Prox RCA to Mid RCA lesion is 25% stenosed.   Prox RCA lesion is 15% stenosed.   Dist RCA lesion is 15% stenosed.   Mid Cx to Dist Cx lesion is 15% stenosed.   Mid LAD-1 lesion is 80% stenosed.   Mid  LAD-2 lesion is 70% stenosed.   Mid LAD to Dist LAD lesion is 70% stenosed.   Ost LAD to Prox LAD lesion is 25% stenosed.   Prox LAD to Mid LAD lesion is 20% stenosed.   - Successful PCI to the mid LAD with placement of a Synergy DES with good  angiographic result and TIMI 3 flow.  -Successful PCI to the distal LAD covering 2 sites with 1 Synergy DES with  good angiographic result and TIMI grade III flow   Recommendations:  - Secondary prevention and medical management  - Dual antiplatelet therapy for at least 12 months  - Other medication changes: We will add Brilinta 90 mg twice daily,  Lipitor 20 mg daily, metoprolol  25 mg daily    Physical Exam:   VS:  BP 92/68   Pulse 74   Ht 5' 11 (1.803 m)   Wt 234 lb 3.2 oz (106.2 kg)   SpO2 95%   BMI 32.66 kg/m    Wt Readings from Last 3 Encounters:  08/31/24 234 lb 3.2 oz (106.2 kg)  08/24/24 240 lb 4 oz (109 kg)  06/14/24 242 lb (109.8 kg)    GEN: Well nourished, well developed in no acute distress HEENT: Normal, moist mucous membranes NECK: No JVD CARDIAC: regular rhythm, normal S1 and S2, no rubs or gallops. No murmur. VASCULAR: Radial and DP pulses 2+ bilaterally. No carotid bruits RESPIRATORY:  Clear to auscultation without rales, wheezing or rhonchi  ABDOMEN: Soft, non-tender, non-distended MUSCULOSKELETAL:  Ambulates independently SKIN: Warm and dry, no edema NEUROLOGIC:  Alert and oriented x 3. No focal neuro deficits noted. PSYCHIATRIC:  Normal affect    ASSESSMENT AND PLAN: .    CAD with history of prior PCI 2022 Hyperlipidemia Statin intolerance -continue aspirin 81 mg daily, repatha, ezetimibe  -reviewed red flag warning signs that need immediate medical attention  Symptomatic bradycardia s/p PPM, requiring revision shortly after due to lead dislodgement/Twiddler's syndrome -he is currently on metoprolol . Unclear if he has had any tachyarrhythmias or significant ectopy. Will use pacemaker interrogations to  help guide therapy. As he has had no angina and has normal EF, would stop metoprolol  if not needed -he wishes to transfer device management to [facility], referral placed  Hypertension -on low side today but he is asymptomatic and reports this is not typical -continue current lasix , lisinopril , metoprolol  for now but cut back if he develops symptoms  OSA on CPAP: continue CPAP  Ascending aortic dilation -measured 4 cm on 07/07/23, repeat ordered with medications for anxiolytic -measured 3.9 cm in 2020 -has upcoming appt 11/4 with CT surgery team  CV risk counseling and prevention -recommend heart healthy/Mediterranean diet, with whole grains, fruits, vegetable, fish, lean meats, nuts, and olive oil. Limit salt. -recommend moderate walking, 3-5 times/week for 30-50 minutes each session. Aim for at least 150 minutes/week. Goal should be pace of 3 miles/hours, or walking 1.5 miles in 30 minutes -recommend avoidance of tobacco products. Avoid excess alcohol.  Dispo: 3 mos with me or APP to follow  up/adjust meds, then can likely stretch appointments if doing well  Signed, Shelda Bruckner, MD   Shelda Bruckner, MD, PhD, [facility]  [facility]  [facility] at [facility] [address] [place], [place] [address] [phone]

## 2024-08-31 NOTE — Patient Instructions (Signed)
 Medication Instructions:  Your physician has recommended you make the following change in your medication:  Start aspirin 81 mg by mouth daily   *If you need a refill on your cardiac medications before your next appointment, please call your pharmacy*  Lab Work: none If you have labs (blood work) drawn today and your tests are completely normal, you will receive your results only by: MyChart Message (if you have MyChart) OR A paper copy in the mail If you have any lab test that is abnormal or we need to change your treatment, we will call you to review the results.  Testing/Procedures: none  Follow-Up: At [facility], you and your health needs are our priority.  As part of our continuing mission to provide you with exceptional heart care, our providers are all part of one team.  This team includes your primary Cardiologist (physician) and Advanced Practice Providers or APPs (Physician Assistants and Nurse Practitioners) who all work together to provide you with the care you need, when you need it.  Your next appointment:   3 month(s)  Provider:   Shelda Bruckner, MD    We recommend signing up for the patient portal called MyChart.  Sign up information is provided on this After Visit Summary.  MyChart is used to connect with patients for Virtual Visits (Telemedicine).  Patients are able to view lab/test results, encounter notes, upcoming appointments, etc.  Non-urgent messages can be sent to your provider as well.   To learn more about what you can do with MyChart, go to [email or web address].   Other Instructions Check BP at home and keep record. Bring to next appointment.

## 2024-08-31 NOTE — Telephone Encounter (Signed)
 Pt was made aware of this during visit on 08/24/24 and pt is schedule to get it done on the 09/04/24

## 2024-09-03 ENCOUNTER — Other Ambulatory Visit

## 2024-09-03 DIAGNOSIS — I10 Essential (primary) hypertension: Secondary | ICD-10-CM

## 2024-09-03 LAB — COMPREHENSIVE METABOLIC PANEL WITH GFR
ALT: 8 U/L (ref 0–53)
AST: 17 U/L (ref 0–37)
Albumin: 4.3 g/dL (ref 3.5–5.2)
Alkaline Phosphatase: 70 U/L (ref 39–117)
BUN: 22 mg/dL (ref 6–23)
CO2: 25 meq/L (ref 19–32)
Calcium: 9.5 mg/dL (ref 8.4–10.5)
Chloride: 102 meq/L (ref 96–112)
Creatinine, Ser: 1.04 mg/dL (ref 0.40–1.50)
GFR: 67.22 mL/min (ref >60.00)
Glucose, Bld: 84 mg/dL (ref 70–99)
Potassium: 3.6 meq/L (ref 3.5–5.1)
Sodium: 140 meq/L (ref 135–145)
Total Bilirubin: 0.7 mg/dL (ref 0.2–1.2)
Total Protein: 6.9 g/dL (ref 6.0–8.3)

## 2024-09-03 LAB — LIPID PANEL
Cholesterol: 112 mg/dL (ref 0–200)
HDL: 42.6 mg/dL (ref >39.00)
LDL Cholesterol: 43 mg/dL (ref 0–99)
NonHDL: 68.99
Total CHOL/HDL Ratio: 3
Triglycerides: 130 mg/dL (ref 0.0–149.0)
VLDL: 26 mg/dL (ref 0.0–40.0)

## 2024-09-03 LAB — PSA: PSA: 0.43 ng/mL (ref 0.10–4.00)

## 2024-09-03 LAB — HEMOGLOBIN A1C: Hgb A1c MFr Bld: 5.9 % (ref 4.6–6.5)

## 2024-09-04 ENCOUNTER — Ambulatory Visit (HOSPITAL_COMMUNITY)
Admission: RE | Admit: 2024-09-04 | Discharge: 2024-09-04 | Disposition: A | Discharge status: Home / Self Care | Source: Ambulatory Visit | Attending: Surgery | Admitting: Surgery

## 2024-09-04 ENCOUNTER — Ambulatory Visit: Payer: Self-pay | Admitting: Nurse Practitioner

## 2024-09-04 DIAGNOSIS — I7121 Aneurysm of the ascending aorta, without rupture: Secondary | ICD-10-CM | POA: Insufficient documentation

## 2024-09-04 MED ORDER — IOHEXOL 350 MG/ML SOLN
80.0000 mL | Freq: Once | INTRAVENOUS | Status: AC | PRN
Start: 2024-09-04 — End: 2024-09-04
  Administered 2024-09-04: 80 mL via INTRAVENOUS

## 2024-09-11 ENCOUNTER — Ambulatory Visit

## 2024-09-11 VITALS — BP 132/76 | HR 78 | Resp 20 | Ht 71.0 in | Wt 236.6 lb

## 2024-09-11 DIAGNOSIS — I7121 Aneurysm of the ascending aorta, without rupture: Secondary | ICD-10-CM

## 2024-09-11 NOTE — Patient Instructions (Signed)

## 2024-09-11 NOTE — Progress Notes (Signed)
 [address] [place] [address]             [phone]            [name] [id number] 21-Dec-1940   History of Present Illness:  Grant Edwards is an 84 year old man with medical history of hypertension, sinus node dysfunction s/p pacemaker replacement,  abdominal aortic aneurysm, aortic atherosclerosis, OSA, asthma, osteoarthritis, gout and prediabetes who presents for continued follow up of ascending thoracic aortic aneurysm.  Aneurysm has stayed stable in size and on recent CTA of chest measured 4.0 cm.  Echocardiogram on 07/2024 showed tricuspid aortic valve.   He reports that he has been doing well.  His blood pressure is well controlled with current medication therapy. His home readings are 130s/70s.  He does not exercise currently due to knee pain from osteoarthritis.  He denies heavy lifting. He denies chest pain, shortness of breath.    Current Outpatient Medications on File Prior to Visit  Medication Sig Dispense Refill   acetaminophen  (TYLENOL ) 650 MG CR tablet Take 650 mg by mouth every 6 (six) hours as needed for pain.     albuterol  (VENTOLIN  HFA) 108 (90 Base) MCG/ACT inhaler Inhale 2 puffs into the lungs every 4 (four) hours as needed for wheezing or shortness of breath. 1 Inhaler 1   allopurinol  (ZYLOPRIM ) 300 MG tablet TAKE 1 TABLET BY MOUTH EVERY DAY IN THE MORNING 90 tablet 0   Ascorbic Acid (VITAMIN C PO) Take 900 mg by mouth daily.     aspirin EC 81 MG tablet Take 1 tablet (81 mg total) by mouth daily. Swallow whole.     Azelastine HCl 137 MCG/SPRAY SOLN Place 1 spray into both nostrils daily as needed (congestion).     Cholecalciferol (VITAMIN D3) 250 MCG (10000 UT) capsule Take 10,000 Units by mouth every 3 (three) days.     clobetasol cream (TEMOVATE) 0.05 % Apply 1 application. topically daily as needed (break outs).     colchicine 0.6 MG tablet Take 0.6-1.2 mg by mouth daily as needed (gout flares.).     COSOPT 2-0.5 %  ophthalmic solution Place 1 drop into the right eye 2 (two) times daily.     Evolocumab (REPATHA) 140 MG/ML SOSY Inject into the skin. Every 2-3 weeks     ezetimibe  (ZETIA ) 10 MG tablet TAKE 1 TABLET BY MOUTH EVERY DAY 90 tablet 0   Faricimab-svoa (VABYSMO IZ) by Intravitreal route.     fluticasone  (FLONASE ) 50 MCG/ACT nasal spray Place 1 spray into both nostrils at bedtime.     furosemide  (LASIX ) 20 MG tablet Take 20 mg by mouth in the morning.     lisinopril  (ZESTRIL ) 40 MG tablet Take 40 mg by mouth.     metoprolol  succinate (TOPROL -XL) 25 MG 24 hr tablet Take 0.5 tablets (12.5 mg total) by mouth in the morning. 90 tablet 1   Multiple Vitamins-Minerals (ZINC PO) Take 50 mg by mouth daily.     vitamin E 180 MG (400 UNITS) capsule Take 400 Units by mouth daily.     No current facility-administered medications on file prior to visit.     ROS: Review of Systems  Constitutional: Negative.  Negative for malaise/fatigue.  Respiratory: Negative.  Negative for cough and shortness of breath.   Cardiovascular: Negative.  Negative for chest pain and leg swelling.     BP 132/76 (BP Location: Right Arm, Patient Position: Sitting,  Cuff Size: Large)   Pulse 78   Resp 20   Ht 5' 11 (1.803 m)   Wt 236 lb 9.6 oz (107.3 kg)   SpO2 96% Comment: RA  BMI 33.00 kg/m   Physical Exam Constitutional:      Appearance: Normal appearance.  HENT:     Head: Normocephalic and atraumatic.  Cardiovascular:     Rate and Rhythm: Normal rate and regular rhythm.     Heart sounds: Normal heart sounds, S1 normal and S2 normal.  Pulmonary:     Effort: Pulmonary effort is normal.     Breath sounds: Normal breath sounds.  Skin:    General: Skin is warm and dry.  Neurological:     General: No focal deficit present.     Mental Status: He is alert and oriented to person, place, and time.      Imaging: CLINICAL DATA:  Thoracic aortic aneurysm surveillance.   EXAM: CT ANGIOGRAPHY CHEST WITH CONTRAST    TECHNIQUE: Multidetector CT imaging of the chest was performed using the standard protocol during bolus administration of intravenous contrast. Multiplanar CT image reconstructions and MIPs were obtained to evaluate the vascular anatomy.   RADIATION DOSE REDUCTION: This exam was performed according to the departmental dose-optimization program which includes automated exposure control, adjustment of the mA and/or kV according to patient size and/or use of iterative reconstruction technique.   CONTRAST:  80mL OMNIPAQUE IOHEXOL 350 MG/ML SOLN   COMPARISON:  07/04/2023   FINDINGS: Cardiovascular: Stable mild cardiomegaly. Moderate calcified plaque over the left main and 3 vessel coronary arteries. Cardiac pacer leads are present.   Aneurysm of the ascending thoracic aorta measuring 4 cm in AP diameter on the axial images unchanged.   Aortic root measures 3.8 cm and sinotubular junction measures 2.8 cm on the coronal reformatted images. Aortic arch measures 3.2 cm. Descending thoracic aorta measures 2.7 cm.   Normal 3 vessel takeoff from the aortic arch.   Pulmonary arterial system is unremarkable. Remaining vascular structures are unchanged.   Mediastinum/Nodes: No mediastinal or hilar adenopathy. Remaining mediastinal structures are unremarkable.   Lungs/Pleura: Lungs are adequately inflated without acute airspace process or effusion. Airways are normal.   Upper Abdomen: Mild calcified plaque over the abdominal aorta. 1 cm hypodensity over the right lobe of the liver unchanged likely cyst or hemangioma.   Musculoskeletal: No focal abnormality.   Review of the MIP images confirms the above findings.   IMPRESSION: 1. Stable 4 cm ascending thoracic aortic aneurysm. Recommend annual imaging followup by CTA or MRA. This recommendation follows 2010 ACCF/AHA/AATS/ACR/ASA/SCA/SCAI/SIR/STS/SVM Guidelines for the Diagnosis and Management of Patients with Thoracic Aortic  Disease. Circulation. 2010; 121: Z733-z630. Aortic aneurysm NOS (ICD10-I71.9). 2. Stable mild cardiomegaly. Atherosclerotic coronary artery disease. 3. 1 cm hypodensity over the right lobe of the liver unchanged likely cyst or hemangioma. 4. Aortic atherosclerosis.   Aortic Atherosclerosis (ICD10-I70.0).     Electronically Signed   By: Toribio Agreste M.D.   On: 09/04/2024 13:04     A/P: Aneurysm of ascending aorta without rupture -4.0 cm ascending thoracic aortic aneurysm on CTA of chest. Echocardiogram showed tricuspid valve.  -We discussed the natural history and and risk factors for growth of ascending aortic aneurysms. Discussed recommendations to minimize the risk of further expansion or dissection including careful blood pressure control, avoidance of contact sports and heavy lifting, attention to lipid management.  We covered the importance of staying never user of tobacco.  The patient does not yet meet  surgical criteria of >5.5cm. The patient is aware of signs and symptoms of aortic dissection and when to present to the emergency department   -Follow up in one year with CTA of chest for continued surveillance     Risk Modification:  Statin:  not currently prescribed  Smoking cessation instruction/counseling given:  never user  Patient was counseled on importance of Blood Pressure Control  They are instructed to contact their Primary Care Physician if they start to have blood pressure readings over 130s/90s. Do not ever stop blood pressure medications on your own, unless instructed by healthcare professional.  Please avoid use of Fluoroquinolones as this can potentially increase your risk of Aortic Rupture and/or Dissection  Patient educated on signs and symptoms of Aortic Dissection, handout also provided in AVS  Manuelita CHRISTELLA Rough, PA-C 09/11/24

## 2024-10-09 ENCOUNTER — Ambulatory Visit

## 2024-10-11 ENCOUNTER — Ambulatory Visit: Admitting: Nurse Practitioner

## 2024-10-11 VITALS — BP 128/74 | HR 78 | Temp 98.1°F | Ht 71.0 in | Wt 237.5 lb

## 2024-10-11 DIAGNOSIS — I714 Abdominal aortic aneurysm, without rupture, unspecified: Secondary | ICD-10-CM

## 2024-10-11 DIAGNOSIS — R7303 Prediabetes: Secondary | ICD-10-CM | POA: Diagnosis not present

## 2024-10-11 DIAGNOSIS — R238 Other skin changes: Secondary | ICD-10-CM | POA: Diagnosis not present

## 2024-10-11 DIAGNOSIS — I700 Atherosclerosis of aorta: Secondary | ICD-10-CM

## 2024-10-11 NOTE — Assessment & Plan Note (Signed)
 Prediabetes A1c at 5.9% in October, indicating prediabetes. - Follow-up appointment in January to recheck A1c. - Encouraged dietary management and weight monitoring.

## 2024-10-11 NOTE — Assessment & Plan Note (Signed)
 Ascending aortic aneurysm, stable Recent CT shows stable condition.

## 2024-10-11 NOTE — Progress Notes (Signed)
 Established Patient Office Visit  Subjective   Patient ID: Grant Edwards, male    DOB: [birth year]  Age: 83 y.o. MRN: [id number]  Chief Complaint  Patient presents with   Medical Management of Chronic Issues    Want to see dermatology for anything cancerous     Discussed the use of AI scribe software for clinical note transcription with the patient, who gave verbal consent to proceed.  History of Present Illness Grant Edwards is an 83 year old male who presents with scalp pain.  Scalp pain and lesion - Scalp pain described as feeling like an open sore or scrape. - Concern for possible lesion on the scalp.  History of precancerous skin lesions - Three precancerous skin spots previously removed from scalp. - Attributed to prolonged sun exposure while living and working in [facility], [place] .      ROS: see HPI    Objective:     BP 128/74   Pulse 78   Temp 98.1 F (36.7 C) (Temporal)   Ht 5' 11 (1.803 m)   Wt 237 lb 8 oz (107.7 kg)   SpO2 95%   BMI 33.12 kg/m  BP Readings from Last 3 Encounters:  10/11/24 128/74  09/11/24 132/76  08/31/24 92/68   Wt Readings from Last 3 Encounters:  10/11/24 237 lb 8 oz (107.7 kg)  09/11/24 236 lb 9.6 oz (107.3 kg)  08/31/24 234 lb 3.2 oz (106.2 kg)      Physical Exam Vitals reviewed.  Constitutional:      Appearance: Normal appearance.  HENT:     Head: Normocephalic and atraumatic.   Cardiovascular:     Rate and Rhythm: Normal rate.  Pulmonary:     Effort: Pulmonary effort is normal.  Musculoskeletal:     Cervical back: Neck supple.  Skin:    General: Skin is warm and dry.  Neurological:     Mental Status: He is alert and oriented to person, place, and time.  Psychiatric:        Mood and Affect: Mood normal.        Behavior: Behavior normal.        Thought Content: Thought content normal.        Judgment: Judgment normal.      No results found for any visits on 10/11/24.    The ASCVD Risk  score (Arnett DK, et al., 2019) failed to calculate for the following reasons:   The 2019 ASCVD risk score is only valid for ages 33 to 40    Assessment & Plan:   Problem List Items Addressed This Visit       Cardiovascular and Mediastinum   AAA (abdominal aortic aneurysm) without rupture   Ascending aortic aneurysm, stable Recent CT shows stable condition.      Aortic atherosclerosis   Hyperlipidemia Cholesterol levels well-controlled, LDL at 43.        Musculoskeletal and Integument   Other skin changes - Primary   Precancerous skin lesions of scalp Reports scalp pain with scabs, history of precancerous lesions. Differential includes recurrence or shingles. - Urgent dermatology referral for evaluation. - Advised to monitor for new lesions or blistering. Call office if blistering occurs. - Discussed potential for shingles vaccination.      Relevant Orders   Ambulatory referral to Dermatology     Other   Prediabetes   Prediabetes A1c at 5.9% in October, indicating prediabetes. - Follow-up appointment in January to recheck A1c. - Encouraged dietary management and weight  monitoring.      Assessment and Plan Assessment & Plan Precancerous skin lesions of scalp Reports scalp pain with scabs, history of precancerous lesions. Differential includes recurrence or shingles. - Urgent dermatology referral for evaluation. Will request records from previous dermatology office [facility]) - Advised to monitor for new lesions or blistering. Call office if blistering occurs. - Discussed potential for shingles vaccination.  Prediabetes A1c at 5.9% in October, indicating prediabetes. - Follow-up appointment in January to recheck A1c. - Encouraged dietary management and weight monitoring.  Ascending aortic aneurysm, stable Recent CT shows stable condition.  Hyperlipidemia Cholesterol levels well-controlled, LDL at 43.    Return in about 8 weeks (around  12/06/2024) for F/U with Calista Crain, prediabetes.    [name], NP

## 2024-10-11 NOTE — Assessment & Plan Note (Signed)
 Hyperlipidemia Cholesterol levels well-controlled, LDL at 43.

## 2024-10-11 NOTE — Assessment & Plan Note (Signed)
 Precancerous skin lesions of scalp Reports scalp pain with scabs, history of precancerous lesions. Differential includes recurrence or shingles. - Urgent dermatology referral for evaluation. - Advised to monitor for new lesions or blistering. Call office if blistering occurs. - Discussed potential for shingles vaccination.

## 2024-10-17 ENCOUNTER — Ambulatory Visit: Payer: Self-pay | Admitting: Cardiology

## 2024-10-17 ENCOUNTER — Ambulatory Visit: Attending: Cardiology | Admitting: Cardiology

## 2024-10-17 ENCOUNTER — Encounter: Payer: Self-pay | Admitting: Cardiology

## 2024-10-17 VITALS — BP 150/90 | HR 78 | Ht 71.0 in | Wt 242.0 lb

## 2024-10-17 DIAGNOSIS — I495 Sick sinus syndrome: Secondary | ICD-10-CM | POA: Diagnosis not present

## 2024-10-17 DIAGNOSIS — I10 Essential (primary) hypertension: Secondary | ICD-10-CM

## 2024-10-17 DIAGNOSIS — G4733 Obstructive sleep apnea (adult) (pediatric): Secondary | ICD-10-CM

## 2024-10-17 DIAGNOSIS — I2510 Atherosclerotic heart disease of native coronary artery without angina pectoris: Secondary | ICD-10-CM

## 2024-10-17 LAB — CUP PACEART INCLINIC DEVICE CHECK
Date Time Interrogation Session: 20251210160330
Implantable Lead Connection Status: 753985
Implantable Lead Connection Status: 753985
Implantable Lead Implant Date: 20250924
Implantable Lead Implant Date: 20250924
Implantable Lead Location: 753859
Implantable Lead Location: 753860
Implantable Lead Model: 4076
Implantable Lead Model: 4076
Implantable Pulse Generator Implant Date: 20250924

## 2024-10-17 NOTE — Progress Notes (Signed)
 Electrophysiology Office Note:   Date:  10/17/2024  ID:  Grant Edwards, DOB 1941/04/03, MRN [id number]  Primary Cardiologist: Shelda Bruckner, MD Primary Heart Failure: None Electrophysiologist: None      History of Present Illness:   Grant Edwards is a 83 y.o. male with h/o coronary artery disease with prior stenting, symptomatic bradycardia, hypertension, sleep apnea seen today for  for Electrophysiology evaluation of pacemaker at the request of Dawna Bruckner.    He had previously been followed by [facility].  He wore a cardiac monitor that showed bradycardia with heart rates of 28 and underwent dual-chamber pacemaker implant.  He had lead dislodgment and had lead revision 2 weeks after.  He also has an ascending aortic aneurysm.  Discussed the use of AI scribe software for clinical note transcription with the patient, who gave verbal consent to proceed.  History of Present Illness Grant Edwards is an 83 year old male who presents for a heart rhythm assessment following pacemaker implantation.  In September, he experienced an episode of waking up at 3 AM gasping for air with a pulse of 28 beats per minute. He was promptly evaluated, and his doctor recommended a pacemaker due to bradycardia.  The initial pacemaker implantation failed within two days due to lead dislodgment, necessitating a second implantation. Since the second device was placed, he has experienced significant improvement in symptoms, including better breathing and increased energy levels. He monitors his heart rate using a watch and notes that the pacemaker appropriately activates at 70 beats per minute, though he sometimes gets concerned when it drops to 69.  He feels well overall since the pacemaker was implanted, despite overexerting himself during Thanksgiving, which led to weight gain. He is confident he [name] lose the weight again. He has four grandchildren who keep him active.  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     Review of systems complete and found to be negative unless listed in HPI.      EP Information / Studies Reviewed:    EKG is not ordered today. EKG from 08/31/24 reviewed which showed atrial paced      PPM Interrogation-  reviewed in detail today,  See PACEART report.  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   Wt Readings from Last 3 Encounters:  10/11/24 237 lb 8 oz (107.7 kg)  09/11/24 236 lb 9.6 oz (107.3 kg)  08/31/24 234 lb 3.2 oz (106.2 kg)     GEN: Well nourished, well developed in no acute distress NECK: No JVD; No carotid bruits CARDIAC: Regular rate and rhythm, no murmurs, rubs, gallops RESPIRATORY:  Clear to auscultation without rales, wheezing or rhonchi  ABDOMEN: Soft, non-tender, non-distended EXTREMITIES:  No edema; No deformity   ASSESSMENT AND PLAN:    SND s/p Medtronic PPM  Normal PPM function See Elisabeth Art report Gabrille Kilbride arrange remote follow-up. No changes today  2.  Coronary artery disease: PCI in 2022.  No current chest pain.  Plan per primary cardiology.  3.  Hypertension: Well-controlled  4.  Obstructive sleep apnea: CPAP compliance encouraged  5.  Ascending aortic dilation: Plan per primary cardiology  Disposition:  Follow up with EP Team in 12 months  Signed, Cyril Railey Gladis Norton, MD

## 2024-10-17 NOTE — Patient Instructions (Signed)
 Medication Instructions:  Your physician recommends that you continue on your current medications as directed. Please refer to the Current Medication list given to you today.  *If you need a refill on your cardiac medications before your next appointment, please call your pharmacy*  Lab Work: None ordered  Testing/Procedures: None ordered  Follow-Up: At [facility], you and your health needs are our priority.  As part of our continuing mission to provide you with exceptional heart care, our providers are all part of one team.  This team includes your primary Cardiologist (physician) and Advanced Practice Providers or APPs (Physician Assistants and Nurse Practitioners) who all work together to provide you with the care you need, when you need it.  Your next appointment:   1 year(s)  Provider:   You will see one of the following Advanced Practice Providers on your designated Care Team:   Charlies Arthur, PA-C Michael Andy Tillery, PA-C Suzann Riddle, NP Daphne Barrack, NP Artist Pouch, PA-C    Thank you for choosing [facility]!!   Maeola Domino, RN [phone]

## 2024-10-18 ENCOUNTER — Ambulatory Visit

## 2024-10-18 VITALS — Ht 71.0 in | Wt 242.0 lb

## 2024-10-18 DIAGNOSIS — Z0000 Encounter for general adult medical examination without abnormal findings: Secondary | ICD-10-CM

## 2024-10-18 NOTE — Progress Notes (Signed)
 Chief Complaint  Patient presents with   Medicare Wellness     Subjective:   Grant Edwards is a 83 y.o. male who presents for a Medicare Annual Wellness Visit.  Visit info / Clinical Intake: Medicare Wellness Visit Type:: Subsequent Annual Wellness Visit Persons participating in visit and providing information:: patient Medicare Wellness Visit Mode:: Telephone If telephone:: video declined Since this visit was completed virtually, some vitals may be partially provided or unavailable. Missing vitals are due to the limitations of the virtual format.: Unable to obtain vitals - no equipment If Telephone or Video please confirm:: I connected with patient using audio/video enable telemedicine. I verified patient identity with two identifiers, discussed telehealth limitations, and patient agreed to proceed. Patient Location:: Home Provider Location:: Home Interpreter Needed?: No Pre-visit prep was completed: yes AWV questionnaire completed by patient prior to visit?: no Living arrangements:: (!) lives alone Patient's Overall Health Status Rating: good Typical amount of pain: some (back pain) Does pain affect daily life?: no Are you currently prescribed opioids?: no  Dietary Habits and Nutritional Risks How many meals a day?: 3 Eats fruit and vegetables daily?: yes Most meals are obtained by: preparing own meals; eating out (per pt-50/50) In the last 2 weeks, have you had any of the following?: none Diabetic:: no  Functional Status Activities of Daily Living (to include ambulation/medication): Independent Ambulation: Independent with device- listed below Home Assistive Devices/Equipment: Cane; CPAP Medication Administration: Independent Home Management (perform basic housework or laundry): Independent Manage your own finances?: yes Primary transportation is: driving Concerns about vision?: no *vision screening is required for WTM* Concerns about hearing?: no  Fall  Screening Falls in the past year?: 0 Number of falls in past year: 1 Was there an injury with Fall?: 0 Fall Risk Category Calculator: 1 Patient Fall Risk Level: Low Fall Risk  Fall Risk Patient at Risk for Falls Due to: Impaired balance/gait Fall risk Follow up: Falls evaluation completed; Falls prevention discussed  Home and Transportation Safety: All rugs have non-skid backing?: N/A, no rugs All stairs or steps have railings?: N/A, no stairs Grab bars in the bathtub or shower?: yes Have non-skid surface in bathtub or shower?: yes Good home lighting?: yes Regular seat belt use?: yes Hospital stays in the last year:: (!) yes How many hospital stays:: 1 Reason: Pacemaker placed  Cognitive Assessment Difficulty concentrating, remembering, or making decisions? : no Will 6CIT or Mini Cog be Completed: no 6CIT or Mini Cog Declined: patient alert, oriented, able to answer questions appropriately and recall recent events  Advance Directives (For Healthcare) Does Patient Have a Medical Advance Directive?: Yes Type of Advance Directive: Healthcare Power of [place]; Living will Copy of Healthcare Power of Attorney in Chart?: Yes - validated most recent copy scanned in chart (See row information) Copy of Living Will in Chart?: Yes - validated most recent copy scanned in chart (See row information)  Reviewed/Updated  Reviewed/Updated: Reviewed All (Medical, Surgical, Family, Medications, Allergies, Care Teams, Patient Goals)    Allergies (verified) Azithromycin, Nsaids, Statins, Quinolones, Aspirin , and Erythromycin   Current Medications (verified) Outpatient Encounter Medications as of 10/18/2024  Medication Sig   acetaminophen  (TYLENOL ) 650 MG CR tablet Take 650 mg by mouth every 6 (six) hours as needed for pain.   albuterol  (VENTOLIN  HFA) 108 (90 Base) MCG/ACT inhaler Inhale 2 puffs into the lungs every 4 (four) hours as needed for wheezing or shortness of breath.   allopurinol   (ZYLOPRIM ) 300 MG tablet TAKE 1 TABLET BY  MOUTH EVERY DAY IN THE MORNING   Ascorbic Acid (VITAMIN C PO) Take 900 mg by mouth daily.   aspirin  EC 81 MG tablet Take 1 tablet (81 mg total) by mouth daily. Swallow whole.   Azelastine HCl 137 MCG/SPRAY SOLN Place 1 spray into both nostrils daily as needed (congestion).   Cholecalciferol (VITAMIN D3) 250 MCG (10000 UT) capsule Take 10,000 Units by mouth every 3 (three) days.   clobetasol cream (TEMOVATE) 0.05 % Apply 1 application. topically daily as needed (break outs).   colchicine 0.6 MG tablet Take 0.6-1.2 mg by mouth daily as needed (gout flares.).   COSOPT 2-0.5 % ophthalmic solution Place 1 drop into the right eye 2 (two) times daily.   Evolocumab (REPATHA) 140 MG/ML SOSY Inject into the skin. Every 2-3 weeks   ezetimibe  (ZETIA ) 10 MG tablet TAKE 1 TABLET BY MOUTH EVERY DAY   Faricimab-svoa (VABYSMO IZ) by Intravitreal route.   fluticasone  (FLONASE ) 50 MCG/ACT nasal spray Place 1 spray into both nostrils at bedtime.   furosemide  (LASIX ) 20 MG tablet Take 20 mg by mouth in the morning.   lisinopril  (ZESTRIL ) 40 MG tablet Take 40 mg by mouth.   metoprolol  succinate (TOPROL -XL) 25 MG 24 hr tablet Take 0.5 tablets (12.5 mg total) by mouth in the morning.   Multiple Vitamins-Minerals (ZINC PO) Take 50 mg by mouth daily.   vitamin E 180 MG (400 UNITS) capsule Take 400 Units by mouth daily.   No facility-administered encounter medications on file as of 10/18/2024.    History: Past Medical History:  Diagnosis Date   AAA (abdominal aortic aneurysm)    abdominal and Acending Aortic    Arthritis    Asthma    PRN inhaler   Claustrophobia    Coronary artery disease    Eczema    Gout    History of kidney stones    x1   Hx of adenomatous colonic polyps    Hyperlipidemia    Hypertension    Macular degeneration    Pneumonia    83 years old   Sleep apnea    CPAP  13 setting   Past Surgical History:  Procedure Laterality Date    COLONOSCOPY W/ POLYPECTOMY  last 2015   multiple   knee intercompartmental replacement  2008   Dr. Jane    POLYPECTOMY     RETINAL LASER PROCEDURE  2010   RHINOPLASTY  1976   TONSILLECTOMY     TOTAL KNEE ARTHROPLASTY Right 02/22/2022   Procedure: TOTAL KNEE ARTHROPLASTY;  Surgeon: Melodi Lerner, MD;  Location: [facility];  Service: Orthopedics;  Laterality: Right;   Family History  Problem Relation Age of Onset   Cancer Mother    Allergic rhinitis Mother    Breast cancer Mother    Heart disease Father    Heart attack Father    Allergic rhinitis Son    Angioedema Neg Hx    Asthma Neg Hx    Eczema Neg Hx    Immunodeficiency Neg Hx    Urticaria Neg Hx    Colon cancer Neg Hx    Colon polyps Neg Hx    Esophageal cancer Neg Hx    Rectal cancer Neg Hx    Stomach cancer Neg Hx    Sleep apnea Neg Hx    Social History   Occupational History   Occupation: Retired [employer]  Tobacco Use   Smoking status: Never   Smokeless tobacco: Never  Vaping Use   Vaping status: Never  Used  Substance and Sexual Activity   Alcohol use: No    Alcohol/week: 0.0 standard drinks of alcohol   Drug use: No   Sexual activity: Not Currently   Tobacco Counseling Counseling given: Not Answered  SDOH Screenings   Food Insecurity: No Food Insecurity (10/18/2024)  Housing: Unknown (10/18/2024)  Transportation Needs: No Transportation Needs (10/18/2024)  Utilities: Not At Risk (10/18/2024)  Alcohol Screen: Low Risk (08/22/2023)  Depression (PHQ2-9): Low Risk (10/18/2024)  Financial Resource Strain: Low Risk (12/23/2023)   Received from [facility]  Physical Activity: Inactive (10/18/2024)  Social Connections: Moderately Integrated (10/18/2024)  Stress: No Stress Concern Present (10/18/2024)  Tobacco Use: Low Risk (10/18/2024)  Recent Concern: Tobacco Use - Medium Risk (09/17/2024)   Received [facility] Literacy: Adequate Health Literacy (10/18/2024)   See flowsheets for full  screening details  Depression Screen PHQ 2 & 9 Depression Scale- Over the past 2 weeks, how often have you been bothered by any of the following problems? Little interest or pleasure in doing things: 0 Feeling down, depressed, or hopeless (PHQ Adolescent also includes...irritable): 0 PHQ-2 Total Score: 0 Trouble falling or staying asleep, or sleeping too much: 0 Feeling tired or having little energy: 0 Poor appetite or overeating (PHQ Adolescent also includes...weight loss): 0 Feeling bad about yourself - or that you are a failure or have let yourself or your family down: 0 Trouble concentrating on things, such as reading the newspaper or watching television (PHQ Adolescent also includes...like school work): 0 Moving or speaking so slowly that other people could have noticed. Or the opposite - being so fidgety or restless that you have been moving around a lot more than usual: 0 Thoughts that you would be better off dead, or of hurting yourself in some way: 0 PHQ-9 Total Score: 0 If you checked off any problems, how difficult have these problems made it for you to do your work, take care of things at home, or get along with other people?: Not difficult at all  Depression Treatment Depression Interventions/Treatment : EYV7-0 Score <4 Follow-up Not Indicated     Goals Addressed   None          Objective:    Today's Vitals   10/18/24 1529  Weight: 242 lb (109.8 kg)  Height: 5' 11 (1.803 m)   Body mass index is 33.75 kg/m.  Hearing/Vision screen Hearing Screening - Comments:: Denies hearing difficulties   Vision Screening - Comments:: Denies vision issues./Dr. Mel  Immunizations and Health Maintenance Health Maintenance  Topic Date Due   Zoster Vaccines- Shingrix (1 of 2) 11/25/1992   DTaP/Tdap/Td (3 - Td or Tdap) 04/10/2005   COVID-19 Vaccine (3 - 2025-26 season) 07/09/2024   Medicare Annual Wellness (AWV)  10/18/2025   Pneumococcal Vaccine: 50+ Years   Completed   Influenza Vaccine  Completed   Meningococcal B Vaccine  Aged Out        Assessment/Plan:  This is a routine wellness examination for [place].  Patient Care Team: Elnor [name] BRAVO, NP as PCP - General (Nurse Practitioner) Lonni Slain, MD as PCP - Cardiology (Cardiology) Inocencio Soyla Lunger, MD as PCP - Electrophysiology (Cardiology) Jarold Mayo, MD as Consulting Physician (Ophthalmology)  I have personally reviewed and noted the following in the patients chart:   Medical and social history Use of alcohol, tobacco or illicit drugs  Current medications and supplements including opioid prescriptions. Functional ability and status Nutritional status Physical activity Advanced directives List of other physicians  Hospitalizations, surgeries, and ER visits in previous 12 months Vitals Screenings to include cognitive, depression, and falls Referrals and appointments  No orders of the defined types were placed in this encounter.  In addition, I have reviewed and discussed with patient certain preventive protocols, quality metrics, and best practice recommendations. A written personalized care plan for preventive services as well as general preventive health recommendations were provided to patient.   Hevin Jeffcoat L Chioke Noxon, CMA   10/18/2024   Return in 1 year (on 10/18/2025).  After Visit Summary: (MyChart) Due to this being a telephonic visit, the after visit summary with patients personalized plan was offered to patient via MyChart   Nurse Notes: Patient is due for a Tdap and a Shingrix vaccine.  He had no other concerns to address today.

## 2024-10-18 NOTE — Patient Instructions (Addendum)
 [name],  Thank you for taking the time for your Medicare Wellness Visit. I appreciate your continued commitment to your health goals. Please review the care plan we discussed, and feel free to reach out if I can assist you further.  Please note that Annual Wellness Visits do not include a physical exam. Some assessments may be limited, especially if the visit was conducted virtually. If needed, we may recommend an in-person follow-up with your provider.  Ongoing Care Seeing your primary care provider every 3 to 6 months helps us  monitor your health and provide consistent, personalized care. Last office visit on 10/11/2024.  You are due for a tetanus and a Shingles vaccine.  Each day, aim for 6 glasses of water , plenty of protein in your diet and try to get up and walk/ stretch every hour for 5-10 minutes at a time, (Until it starts to warm up).  Merry Christmas to you.  Referrals If a referral was made during today's visit and you haven't received any updates within two weeks, please contact the referred provider directly to check on the status.  Recommended Screenings:  Health Maintenance  Topic Date Due   Zoster (Shingles) Vaccine (1 of 2) 11/25/1992   DTaP/Tdap/Td vaccine (3 - Td or Tdap) 04/10/2005   COVID-19 Vaccine (3 - 2025-26 season) 07/09/2024   Medicare Annual Wellness Visit  08/21/2024   Pneumococcal Vaccine for age over 40  Completed   Flu Shot  Completed   Meningitis B Vaccine  Aged Out       10/18/2024    3:31 PM  Advanced Directives  Does Patient Have a Medical Advance Directive? Yes  Type of Estate Agent of [place];Living will  Copy of Healthcare Power of Attorney in Chart? Yes - validated most recent copy scanned in chart (See row information)    Vision: Annual vision screenings are recommended for early detection of glaucoma, cataracts, and diabetic retinopathy. These exams can also reveal signs of chronic conditions such as diabetes and  high blood pressure.  Dental: Annual dental screenings help detect early signs of oral cancer, gum disease, and other conditions linked to overall health, including heart disease and diabetes.  Please see the attached documents for additional preventive care recommendations.

## 2024-11-19 NOTE — Progress Notes (Unsigned)
 " Cardiology Office Note:  .   Date:  11/20/2024  ID:  Grant Edwards, DOB [birth year], MRN [id number] PCP: Elnor [name] BRAVO, NP  [facility] Providers Cardiologist:  Shelda Bruckner, MD Electrophysiologist:  Will Gladis Norton, MD {  History of Present Illness: .   Grant Edwards is a 84 y.o. male with PMH CAD with prior stenting, bradycardia s/p PPM, hypertension, OSA on CPAP. He established care with me on 08/31/24.  CV history: was followed by [facility]. After monitor showed bradycardia with rates as low as 28 bpm, he underwent placement of dual chamber pacemaker on 07/19/24. Unfortunately he had lead dislodgement and had to undergo lead revision on 08/01/24. My initial note 08/31/24 has summary of his outside cardiac testing. Notably, underwent cath with PCI to mid LAD and distal LAD 06/2021  See initial note; despite prior PCI, was not on any antiplatelet or anticoagulant at the time of my initial visit, and he reports that he had not been taking anything post stenting. Reports intolerance to aspirin  (asthma) but had tolerated using aspirin  intermittently in recent times. Also notes history of statin intolerance, is on PCSK9i.  Today: Doing well overall. Gained some weight over the holidays but planning to lose this. Has otherwise been watching his diet.  ROS positive for chronic joint pain since his time in [employer], working with the [place] and Dr. Bonner on this. On a bad day, only moves when he has to, has to use his walker. On a good day today, able to move, walk with cane. Asking about cardiac rehab, discussed criteria. He would be interested in PREP program if not a cardiac rehab candidate.  Reviewed his device interrogation. Will also ask Dr. Norton to review to see if he sees any indication to continue metoprolol . Patient does not recall why this was started.  No issues on aspirin , no bleeding. Takes lasix  as needed, urinates a lot at baseline. Uses it when  swelling in his feet builds up, about once every 3 days or so.  ROS: Denies chest pain, shortness of breath at rest or with normal exertion. No PND, orthopnea, severe LE edema or unexpected weight gain. No syncope or palpitations. ROS otherwise negative except as noted.   Studies Reviewed: SABRA    EKG:       Physical Exam:   VS:  BP 116/74   Pulse 78   Ht 5' 11 (1.803 m)   Wt 242 lb (109.8 kg)   SpO2 95%   BMI 33.75 kg/m    Wt Readings from Last 3 Encounters:  11/20/24 242 lb (109.8 kg)  10/18/24 242 lb (109.8 kg)  10/17/24 242 lb (109.8 kg)    GEN: Well nourished, well developed in no acute distress HEENT: Normal, moist mucous membranes NECK: No JVD CARDIAC: regular rhythm, normal S1 and S2, no rubs or gallops. No murmur. VASCULAR: Radial and DP pulses 2+ bilaterally. No carotid bruits RESPIRATORY:  Clear to auscultation without rales, wheezing or rhonchi  ABDOMEN: Soft, non-tender, non-distended MUSCULOSKELETAL:  Ambulates independently SKIN: Warm and dry, no edema NEUROLOGIC:  Alert and oriented x 3. No focal neuro deficits noted. PSYCHIATRIC:  Normal affect    ASSESSMENT AND PLAN: .    CAD with history of prior PCI 2022 Hyperlipidemia Statin intolerance -continue aspirin  81 mg daily, repatha, ezetimibe  -Lipids from 09/03/24 with LDL 43, at goal -reviewed red flag warning signs that need immediate medical attention -no angina. Would be interested in cardiac rehab, but he  does not currently have indications for this. We also discussed PREP program  Symptomatic bradycardia s/p PPM, requiring revision shortly after due to lead dislodgement/Twiddler's syndrome -he is currently on metoprolol . Unclear if he has had any tachyarrhythmias or significant ectopy prior to his establishment with us . Will use pacemaker interrogations to help guide therapy. As he has had no angina and has normal EF, would stop metoprolol  if not needed -now sees Dr. Inocencio, device followed in EP  clinic. I sent a message to Dr. Inocencio re: his thoughts on the metoprolol   Hypertension -continue current lasix  PRN, lisinopril  40 mg daily -continue metoprolol  for now, as above  OSA on CPAP: continue CPAP, followed by neurology  Ascending aortic dilation -measured 4 cm on 07/07/23, 4 cm on 09/04/24 -measured 3.9 cm in 2020 -followed by CT surgery team  CV risk counseling and prevention -recommend heart healthy/Mediterranean diet, with whole grains, fruits, vegetable, fish, lean meats, nuts, and olive oil. Limit salt. -recommend moderate walking, 3-5 times/week for 30-50 minutes each session. Aim for at least 150 minutes/week. Goal should be pace of 3 miles/hours, or walking 1.5 miles in 30 minutes -recommend avoidance of tobacco products. Avoid excess alcohol.  Dispo: 6 mos or sooner as needed  Signed, Shelda Bruckner, MD   Shelda Bruckner, MD, PhD, [facility]  [facility]  [place]  [facility] at [facility] at [facility] [address] [place], [place] [address] [phone]   "

## 2024-11-20 ENCOUNTER — Encounter (HOSPITAL_BASED_OUTPATIENT_CLINIC_OR_DEPARTMENT_OTHER): Payer: Self-pay | Admitting: Cardiology

## 2024-11-20 ENCOUNTER — Ambulatory Visit (INDEPENDENT_AMBULATORY_CARE_PROVIDER_SITE_OTHER): Admitting: Cardiology

## 2024-11-20 VITALS — BP 116/74 | HR 78 | Ht 71.0 in | Wt 242.0 lb

## 2024-11-20 DIAGNOSIS — I7121 Aneurysm of the ascending aorta, without rupture: Secondary | ICD-10-CM

## 2024-11-20 DIAGNOSIS — I2510 Atherosclerotic heart disease of native coronary artery without angina pectoris: Secondary | ICD-10-CM | POA: Diagnosis not present

## 2024-11-20 DIAGNOSIS — Z789 Other specified health status: Secondary | ICD-10-CM | POA: Diagnosis not present

## 2024-11-20 DIAGNOSIS — I10 Essential (primary) hypertension: Secondary | ICD-10-CM

## 2024-11-20 DIAGNOSIS — E7800 Pure hypercholesterolemia, unspecified: Secondary | ICD-10-CM | POA: Diagnosis not present

## 2024-11-20 DIAGNOSIS — Z955 Presence of coronary angioplasty implant and graft: Secondary | ICD-10-CM | POA: Diagnosis not present

## 2024-11-20 DIAGNOSIS — Z950 Presence of cardiac pacemaker: Secondary | ICD-10-CM

## 2024-11-20 NOTE — Patient Instructions (Signed)
 Medication Instructions:  No changes *If you need a refill on your cardiac medications before your next appointment, please call your pharmacy*  Lab Work: none If you have labs (blood work) drawn today and your tests are completely normal, you will receive your results only by: MyChart Message (if you have MyChart) OR A paper copy in the mail If you have any lab test that is abnormal or we need to change your treatment, we will call you to review the results.  Testing/Procedures: none  Follow-Up: At [facility], you and your health needs are our priority.  As part of our continuing mission to provide you with exceptional heart care, our providers are all part of one team.  This team includes your primary Cardiologist (physician) and Advanced Practice Providers or APPs (Physician Assistants and Nurse Practitioners) who all work together to provide you with the care you need, when you need it.  Your next appointment:   6 month(s)  Provider:   Shelda Bruckner, MD, Rosaline Bane, NP, or Reche Finder, NP    We recommend signing up for the patient portal called MyChart.  Sign up information is provided on this After Visit Summary.  MyChart is used to connect with patients for Virtual Visits (Telemedicine).  Patients are able to view lab/test results, encounter notes, upcoming appointments, etc.  Non-urgent messages can be sent to your provider as well.   To learn more about what you can do with MyChart, go to [email or web address].

## 2024-11-22 ENCOUNTER — Telehealth (HOSPITAL_BASED_OUTPATIENT_CLINIC_OR_DEPARTMENT_OTHER): Payer: Self-pay | Admitting: *Deleted

## 2024-11-22 DIAGNOSIS — E7800 Pure hypercholesterolemia, unspecified: Secondary | ICD-10-CM

## 2024-11-22 DIAGNOSIS — Z789 Other specified health status: Secondary | ICD-10-CM

## 2024-11-22 DIAGNOSIS — I10 Essential (primary) hypertension: Secondary | ICD-10-CM

## 2024-11-22 DIAGNOSIS — I2510 Atherosclerotic heart disease of native coronary artery without angina pectoris: Secondary | ICD-10-CM

## 2024-11-22 NOTE — Telephone Encounter (Signed)
 Received message from Dr. Lonni:  I told this patient I would look into cardiac rehab for him, and I did. He doesn't have current covered diagnosis for this, but if he is interested, we can send him to the PREP program. I also sent a note to Dr. Inocencio to see what he thinks about the metoprolol , and I'll follow up about that. Could you ask him if he wants to try PREP?  ____________________________________________________________  Called and left detailed message on self-identified VM of the above.  Placed order for PREP and adv patient if he is interested he can discuss when they call him to schedule a pre consultation.

## 2024-11-27 ENCOUNTER — Telehealth: Payer: Self-pay

## 2024-11-27 NOTE — Telephone Encounter (Signed)
 Called  re: PREP program referral, left voicemail requesting return call.

## 2024-12-03 ENCOUNTER — Telehealth: Payer: Self-pay

## 2024-12-03 NOTE — Telephone Encounter (Signed)
 Left message about the PREP program and asked him to return my call.

## 2024-12-04 ENCOUNTER — Telehealth: Payer: Self-pay

## 2024-12-04 NOTE — Telephone Encounter (Signed)
 Spoke with Carlin about [facility] and he is interested in taking the class at [facility]. Adonna will call him back.

## 2024-12-04 NOTE — Telephone Encounter (Signed)
 Returned my call, he wants [name] on 12/31/24 every M/W at 2pm; assessment visit scheduled for 2/18 at 10am

## 2024-12-04 NOTE — Telephone Encounter (Signed)
 Called as he is interested in the PREP program at the [facility], call went immediately to voicemail; left voicemail requesting return call and also sent him a text message requesting the same.

## 2024-12-07 ENCOUNTER — Telehealth: Payer: Self-pay

## 2024-12-07 DIAGNOSIS — E790 Hyperuricemia without signs of inflammatory arthritis and tophaceous disease: Secondary | ICD-10-CM

## 2024-12-07 NOTE — Telephone Encounter (Signed)
 Copied from CRM #[id number]. Topic: Clinical - Medication Refill >> Dec 07, 2024 12:50 PM Chasity T wrote: Medication: allopurinol  (ZYLOPRIM ) 300 MG tablet   Has the patient contacted their pharmacy? Yes   This is the patient's preferred pharmacy:  [id number] - [place], [place] - [address] [place] [address] Phone: [phone] Fax: [phone]  Is this the correct pharmacy for this prescription? Yes If no, delete pharmacy and type the correct one.   Has the prescription been filled recently? No  Is the patient out of the medication? Yes  Has the patient been seen for an appointment in the last year OR does the patient have an upcoming appointment? Yes  Can we respond through MyChart? Yes  Agent: Please be advised that Rx refills may take up to 3 business days. We ask that you follow-up with your pharmacy.

## 2024-12-10 MED ORDER — ALLOPURINOL 300 MG PO TABS: 300.0000 mg | ORAL_TABLET | Freq: Every morning | ORAL | 0 refills | Status: AC

## 2025-01-17 ENCOUNTER — Ambulatory Visit

## 2025-02-22 ENCOUNTER — Ambulatory Visit: Admitting: Nurse Practitioner

## 2025-04-18 ENCOUNTER — Ambulatory Visit

## 2025-06-20 ENCOUNTER — Ambulatory Visit: Admitting: Adult Health

## 2025-06-24 ENCOUNTER — Ambulatory Visit: Admitting: Physician Assistant

## 2025-07-18 ENCOUNTER — Ambulatory Visit

## 2025-10-17 ENCOUNTER — Ambulatory Visit
